# Patient Record
Sex: Female | Born: 1970 | Race: Black or African American | Hispanic: No | Marital: Married | Smoking: Never smoker
Health system: Southern US, Community
[De-identification: ages and names within clinical notes are randomized; demographics above are authoritative.]

## PROBLEM LIST (undated history)

## (undated) DIAGNOSIS — R002 Palpitations: Secondary | ICD-10-CM

## (undated) DIAGNOSIS — R42 Dizziness and giddiness: Secondary | ICD-10-CM

## (undated) DIAGNOSIS — E119 Type 2 diabetes mellitus without complications: Secondary | ICD-10-CM

## (undated) DIAGNOSIS — O021 Missed abortion: Secondary | ICD-10-CM

## (undated) DIAGNOSIS — Z973 Presence of spectacles and contact lenses: Secondary | ICD-10-CM

## (undated) HISTORY — DX: Type 2 diabetes mellitus without complications: E11.9

## (undated) HISTORY — DX: Palpitations: R00.2

---

## 2006-10-08 ENCOUNTER — Emergency Department (HOSPITAL_COMMUNITY): Admission: EM | Admit: 2006-10-08 | Discharge: 2006-10-08 | Payer: Self-pay | Admitting: Family Medicine

## 2008-06-03 HISTORY — PX: DILATION AND CURETTAGE OF UTERUS: SHX78

## 2011-11-11 ENCOUNTER — Ambulatory Visit (INDEPENDENT_AMBULATORY_CARE_PROVIDER_SITE_OTHER): Payer: BC Managed Care – PPO | Admitting: Family Medicine

## 2011-11-11 VITALS — BP 114/78 | HR 67 | Temp 98.2°F | Resp 16 | Ht 62.0 in | Wt 179.6 lb

## 2011-11-11 DIAGNOSIS — Z111 Encounter for screening for respiratory tuberculosis: Secondary | ICD-10-CM

## 2011-11-11 NOTE — Progress Notes (Signed)
    Patient Name: Madeline Maynard Date of Birth: [birth year] Medical Record Number: [id number] Gender: female Date of Encounter: 11/11/2011  History of Present Illness:  Madeline Maynard is a 41 y.o. very pleasant female patient who presents with the following:  Here for paperwork to care for foster children.  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  Otherwise no health problems.    There is no problem list on file for this patient.  No past medical history on file. No past surgical history on file. History  Substance Use Topics  . Smoking status: Never Smoker   . Smokeless tobacco: Not on file  . Alcohol Use: Not on file   No family history on file. No Known Allergies  Medication list has been reviewed and updated.  Prior to Admission medications   Not on File    Review of Systems:  As per HPI- otherwise negative. Reports that she has no illness, uses no medications, no limitations on her activities  Physical Examination: Filed Vitals:   11/11/11 1422  BP: 114/78  Pulse: 67  Temp: 98.2 F (36.8 C)  Resp: 16   Filed Vitals:   11/11/11 1422  Height: 5\' 2"  (1.575 m)  Weight: 179 lb 10.1 oz (81.48 kg)   Body mass index is 32.85 kg/(m^2).  GEN: WDWN, NAD, Non-toxic, A & O x 3 HEENT: Atraumatic, Normocephalic. Neck supple. No masses, No LAD. Ears and Nose: No external deformity. CV: RRR, No M/G/R. No JVD. No thrill. No extra heart sounds. PULM: CTA B, no wheezes, crackles, rhonchi. No retractions. No resp. distress. No accessory muscle use. EXTR: No c/c/e NEURO Normal gait.  PSYCH: Normally interactive. Conversant. Not depressed or anxious appearing.  Calm demeanor.    Assessment and Plan: Cleared for foster care.  History of BCG and positive PPD- she had a negative CXR in 08/2010.  Should be cleared for caring for foster children. If she needs an updated CXR we are glad to do this for her.     Abbe Amsterdam, MD

## 2011-12-02 DIAGNOSIS — Z0271 Encounter for disability determination: Secondary | ICD-10-CM

## 2011-12-20 ENCOUNTER — Encounter: Payer: Self-pay | Admitting: Family Medicine

## 2011-12-20 LAB — TB SKIN TEST

## 2012-03-20 ENCOUNTER — Ambulatory Visit (INDEPENDENT_AMBULATORY_CARE_PROVIDER_SITE_OTHER): Payer: BC Managed Care – PPO | Admitting: Family Medicine

## 2012-03-20 ENCOUNTER — Ambulatory Visit: Payer: BC Managed Care – PPO

## 2012-03-20 VITALS — BP 124/80 | HR 77 | Temp 98.3°F | Resp 16 | Ht 61.0 in | Wt 177.8 lb

## 2012-03-20 DIAGNOSIS — M542 Cervicalgia: Secondary | ICD-10-CM

## 2012-03-20 DIAGNOSIS — M62838 Other muscle spasm: Secondary | ICD-10-CM

## 2012-03-20 DIAGNOSIS — R51 Headache: Secondary | ICD-10-CM

## 2012-03-20 MED ORDER — CYCLOBENZAPRINE HCL 5 MG PO TABS: ORAL_TABLET | ORAL | Status: DC
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
## 2012-06-03 DIAGNOSIS — Z0271 Encounter for disability determination: Secondary | ICD-10-CM

## 2012-10-10 ENCOUNTER — Ambulatory Visit: Payer: BC Managed Care – PPO | Admitting: Internal Medicine

## 2012-10-10 ENCOUNTER — Ambulatory Visit: Payer: BC Managed Care – PPO

## 2012-10-10 VITALS — BP 110/66 | HR 77 | Temp 99.0°F | Resp 18 | Wt 186.0 lb

## 2012-10-10 DIAGNOSIS — S335XXA Sprain of ligaments of lumbar spine, initial encounter: Secondary | ICD-10-CM

## 2012-10-10 DIAGNOSIS — M545 Low back pain, unspecified: Secondary | ICD-10-CM

## 2012-10-10 DIAGNOSIS — S39012A Strain of muscle, fascia and tendon of lower back, initial encounter: Secondary | ICD-10-CM

## 2012-10-10 MED ORDER — METHOCARBAMOL 750 MG PO TABS: 750.0000 mg | ORAL_TABLET | Freq: Four times a day (QID) | ORAL | Status: DC

## 2012-10-10 MED ORDER — IBUPROFEN 600 MG PO TABS: 600.0000 mg | ORAL_TABLET | Freq: Three times a day (TID) | ORAL | Status: DC | PRN

## 2012-10-10 NOTE — Progress Notes (Signed)
  Subjective:    Patient ID: Madeline Maynard, female    DOB: 06-25-1970, 42 y.o.   MRN: [id number]  HPI Lumbar pain for 1 week, lifts heavy stuff all week. Pain is upper lumbar and no radiation, weakness or numbness into legs. Needs a note for work. No incontinence or rash.   Review of Systems neg    Objective:   Physical Exam  Vitals reviewed. Constitutional: She is oriented to person, place, and time. She appears well-developed and well-nourished.  Cardiovascular: Normal rate.   Pulmonary/Chest: Effort normal.  Abdominal: Soft. There is no tenderness.  Musculoskeletal: She exhibits tenderness.       Lumbar back: She exhibits decreased range of motion, tenderness, bony tenderness, pain and spasm. She exhibits no swelling, no edema, no deformity, no laceration and normal pulse.       Arms: Neurological: She is alert and oriented to person, place, and time. She has normal strength and normal reflexes. No cranial nerve deficit or sensory deficit. Coordination and gait normal.   [facility] reading (PRIMARY) by  Dr.Cyan Moultrie normal xr         Assessment & Plan:  Lumbar strain RICE/Motrin/Robaxin/Exercises

## 2012-10-10 NOTE — Patient Instructions (Signed)
Back Exercises Back exercises help treat and prevent back injuries. The goal of back exercises is to increase the strength of your abdominal and back muscles and the flexibility of your back. These exercises should be started when you no longer have back pain. Back exercises include:  Pelvic Tilt. Lie on your back with your knees bent. Tilt your pelvis until the lower part of your back is against the floor. Hold this position 5 to 10 sec and repeat 5 to 10 times.  Knee to Chest. Pull first 1 knee up against your chest and hold for 20 to 30 seconds, repeat this with the other knee, and then both knees. This may be done with the other leg straight or bent, whichever feels better.  Sit-Ups or Curl-Ups. Bend your knees 90 degrees. Start with tilting your pelvis, and do a partial, slow sit-up, lifting your trunk only 30 to 45 degrees off the floor. Take at least 2 to 3 seconds for each sit-up. Do not do sit-ups with your knees out straight. If partial sit-ups are difficult, simply do the above but with only tightening your abdominal muscles and holding it as directed.  Hip-Lift. Lie on your back with your knees flexed 90 degrees. Push down with your feet and shoulders as you raise your hips a couple inches off the floor; hold for 10 seconds, repeat 5 to 10 times.  Back arches. Lie on your stomach, propping yourself up on bent elbows. Slowly press on your hands, causing an arch in your low back. Repeat 3 to 5 times. Any initial stiffness and discomfort should lessen with repetition over time.  Shoulder-Lifts. Lie face down with arms beside your body. Keep hips and torso pressed to floor as you slowly lift your head and shoulders off the floor. Do not overdo your exercises, especially in the beginning. Exercises may cause you some mild back discomfort which lasts for a few minutes; however, if the pain is more severe, or lasts for more than 15 minutes, do not continue exercises until you see your caregiver.  Improvement with exercise therapy for back problems is slow.  See your caregivers for assistance with developing a proper back exercise program. Document Released: 06/27/2004 Document Revised: 08/12/2011 Document Reviewed: 03/21/2011 [employer] Patient Information 2013 [employer].  

## 2013-06-18 ENCOUNTER — Ambulatory Visit: Payer: BC Managed Care – PPO | Admitting: Physician Assistant

## 2013-06-18 VITALS — BP 102/60 | HR 73 | Temp 98.9°F | Resp 18 | Ht 61.0 in | Wt 180.0 lb

## 2013-06-18 DIAGNOSIS — N939 Abnormal uterine and vaginal bleeding, unspecified: Secondary | ICD-10-CM

## 2013-06-18 DIAGNOSIS — Z3201 Encounter for pregnancy test, result positive: Secondary | ICD-10-CM

## 2013-06-18 DIAGNOSIS — N926 Irregular menstruation, unspecified: Secondary | ICD-10-CM

## 2013-06-18 LAB — POCT URINE PREGNANCY: Preg Test, Ur: POSITIVE

## 2013-06-18 MED ORDER — PRENATAL VITAMINS 28-0.8 MG PO TABS: 1.0000 | ORAL_TABLET | Freq: Every day | ORAL | Status: DC

## 2013-06-18 NOTE — Progress Notes (Signed)
   Subjective:    Patient ID: Madeline Maynard, female    DOB: [birth year], 43 y.o.   MRN: [id number]  HPI 43 year old female presents for evaluation of missed period.  LNMP 05/16/13. She does have 28 day regular cycles.  Denies vaginal discharge, bleeding/spotting, abdominal pain, nausea, or vomiting.  Does admit to some breast tenderness and fatigue.  Requesting pregnancy test. Has not taken one at home. Not currently trying to become pregnant. Has 3 children at home - youngest in 9th grade.  Is engaged - her fiancee does not have any children but does want them.   Not currently taking any medications.  Denies ETOH or cigarette use.  Patient is otherwise doing well with no other concerns today.     Review of Systems  Gastrointestinal: Negative for nausea, vomiting and abdominal pain.  Genitourinary: Positive for menstrual problem (irregular). Negative for vaginal bleeding, vaginal discharge and pelvic pain.       Objective:   Physical Exam  Constitutional: She is oriented to person, place, and time. She appears well-developed and well-nourished.  HENT:  Head: Normocephalic and atraumatic.  Right Ear: External ear normal.  Left Ear: External ear normal.  Eyes: Conjunctivae are normal.  Neck: Normal range of motion.  Cardiovascular: Normal rate.   Pulmonary/Chest: Effort normal.  Neurological: She is oriented to person, place, and time.  Psychiatric: She has a normal mood and affect. Her behavior is normal. Judgment and thought content normal.     Results for orders placed in visit on 06/18/13  POCT URINE PREGNANCY      Result Value Range   Preg Test, Ur Positive          Assessment & Plan:  Abnormal menses - Plan: POCT urine pregnancy, Ambulatory referral to Obstetrics / Gynecology  Positive pregnancy test  Discussed positive test today.  She does plan to carry out pregnancy. Does not have OB at this time.  Would like to go to [facility] if possible Start  prenatal vitamins today.  Referral placed for 1st OB visit Follow up here or at [facility] as needed.

## 2013-07-14 ENCOUNTER — Encounter (HOSPITAL_BASED_OUTPATIENT_CLINIC_OR_DEPARTMENT_OTHER): Payer: Self-pay | Admitting: *Deleted

## 2013-07-14 NOTE — Progress Notes (Signed)
NPO AFTER MN WITH EXCEPTION CLEAR LIQUIDS UNTIL 0700.  PRE-ORDERS PENDING.

## 2013-07-15 ENCOUNTER — Ambulatory Visit (HOSPITAL_BASED_OUTPATIENT_CLINIC_OR_DEPARTMENT_OTHER): Payer: BC Managed Care – PPO | Admitting: Anesthesiology

## 2013-07-15 ENCOUNTER — Encounter (HOSPITAL_BASED_OUTPATIENT_CLINIC_OR_DEPARTMENT_OTHER): Payer: Self-pay | Admitting: *Deleted

## 2013-07-15 ENCOUNTER — Encounter (HOSPITAL_BASED_OUTPATIENT_CLINIC_OR_DEPARTMENT_OTHER)
Admission: RE | Disposition: A | Discharge status: Home / Self Care | Payer: Self-pay | Source: Ambulatory Visit | Attending: Obstetrics and Gynecology

## 2013-07-15 ENCOUNTER — Ambulatory Visit (HOSPITAL_BASED_OUTPATIENT_CLINIC_OR_DEPARTMENT_OTHER)
Admission: RE | Admit: 2013-07-15 | Discharge: 2013-07-15 | Disposition: A | Discharge status: Home / Self Care | Payer: BC Managed Care – PPO | Source: Ambulatory Visit | Attending: Obstetrics and Gynecology | Admitting: Obstetrics and Gynecology

## 2013-07-15 ENCOUNTER — Encounter (HOSPITAL_BASED_OUTPATIENT_CLINIC_OR_DEPARTMENT_OTHER): Payer: BC Managed Care – PPO | Admitting: Anesthesiology

## 2013-07-15 DIAGNOSIS — O021 Missed abortion: Secondary | ICD-10-CM | POA: Insufficient documentation

## 2013-07-15 HISTORY — DX: Presence of spectacles and contact lenses: Z97.3

## 2013-07-15 HISTORY — PX: DILATION AND EVACUATION: SHX1459

## 2013-07-15 HISTORY — DX: Missed abortion: O02.1

## 2013-07-15 LAB — CBC
HCT: 36.5 % (ref 36.0–46.0)
Hemoglobin: 12.3 g/dL (ref 12.0–15.0)
MCH: 26.9 pg (ref 26.0–34.0)
MCHC: 33.7 g/dL (ref 30.0–36.0)
MCV: 79.9 fL (ref 78.0–100.0)
Platelets: 242 10*3/uL (ref 150–400)
RBC: 4.57 MIL/uL (ref 3.87–5.11)
RDW: 15.5 % (ref 11.5–15.5)
WBC: 5.4 10*3/uL (ref 4.0–10.5)

## 2013-07-15 LAB — ABO/RH: ABO/RH(D): O POS

## 2013-07-15 SURGERY — DILATION AND EVACUATION, UTERUS
Anesthesia: Choice

## 2013-07-15 SURGERY — DILATION AND EVACUATION, UTERUS
Anesthesia: Monitor Anesthesia Care | Site: Uterus

## 2013-07-15 MED ORDER — DEXAMETHASONE SODIUM PHOSPHATE 4 MG/ML IJ SOLN
INTRAMUSCULAR | Status: DC | PRN
  Administered 2013-07-15: 10 mg via INTRAVENOUS

## 2013-07-15 MED ORDER — ONDANSETRON HCL 4 MG/2ML IJ SOLN
INTRAMUSCULAR | Status: DC | PRN
  Administered 2013-07-15: 4 mg via INTRAVENOUS

## 2013-07-15 MED ORDER — MIDAZOLAM HCL 2 MG/2ML IJ SOLN
INTRAMUSCULAR | Status: AC
  Filled 2013-07-15: qty 2

## 2013-07-15 MED ORDER — LACTATED RINGERS IV SOLN
INTRAVENOUS | Status: DC
  Administered 2013-07-15 (×3): via INTRAVENOUS
  Filled 2013-07-15: qty 1000

## 2013-07-15 MED ORDER — PROMETHAZINE HCL 25 MG/ML IJ SOLN
6.2500 mg | INTRAMUSCULAR | Status: DC | PRN
  Filled 2013-07-15: qty 1

## 2013-07-15 MED ORDER — KETOROLAC TROMETHAMINE 30 MG/ML IJ SOLN
INTRAMUSCULAR | Status: DC | PRN
  Administered 2013-07-15: 30 mg via INTRAVENOUS

## 2013-07-15 MED ORDER — IBUPROFEN 200 MG PO TABS: 600.0000 mg | ORAL_TABLET | Freq: Four times a day (QID) | ORAL | Status: DC | PRN

## 2013-07-15 MED ORDER — MIDAZOLAM HCL 5 MG/5ML IJ SOLN
INTRAMUSCULAR | Status: DC | PRN
  Administered 2013-07-15: 2 mg via INTRAVENOUS

## 2013-07-15 MED ORDER — DEXTROSE 5 % IV SOLN
2.0000 g | INTRAVENOUS | Status: AC
  Administered 2013-07-15: 2 g via INTRAVENOUS
  Filled 2013-07-15: qty 2

## 2013-07-15 MED ORDER — PROPOFOL 10 MG/ML IV EMUL
INTRAVENOUS | Status: DC | PRN
  Administered 2013-07-15: 200 ug/kg/min via INTRAVENOUS

## 2013-07-15 MED ORDER — FENTANYL CITRATE 0.05 MG/ML IJ SOLN
25.0000 ug | INTRAMUSCULAR | Status: DC | PRN
  Filled 2013-07-15: qty 1

## 2013-07-15 MED ORDER — FENTANYL CITRATE 0.05 MG/ML IJ SOLN
INTRAMUSCULAR | Status: AC
  Filled 2013-07-15: qty 6

## 2013-07-15 MED ORDER — FENTANYL CITRATE 0.05 MG/ML IJ SOLN
INTRAMUSCULAR | Status: DC | PRN
  Administered 2013-07-15: 25 ug via INTRAVENOUS
  Administered 2013-07-15: 50 ug via INTRAVENOUS
  Administered 2013-07-15: 25 ug via INTRAVENOUS

## 2013-07-15 MED ORDER — LIDOCAINE HCL 1 % IJ SOLN
INTRAMUSCULAR | Status: DC | PRN
  Administered 2013-07-15: 10 mL

## 2013-07-15 MED ORDER — HYDROCODONE-IBUPROFEN 7.5-200 MG PO TABS: 1.0000 | ORAL_TABLET | Freq: Three times a day (TID) | ORAL | Status: DC | PRN

## 2013-07-15 SURGICAL SUPPLY — 32 items
CATH ROBINSON RED A/P 16FR (CATHETERS) ×3 IMPLANT
CLOTH BEACON ORANGE TIMEOUT ST (SAFETY) ×3 IMPLANT
COVER TABLE BACK 60X90 (DRAPES) ×3 IMPLANT
DRAPE LG THREE QUARTER DISP (DRAPES) ×3 IMPLANT
DRAPE UNDERBUTTOCKS STRL (DRAPE) ×3 IMPLANT
DRESSING TELFA 8X3 (GAUZE/BANDAGES/DRESSINGS) ×3 IMPLANT
GLOVE BIO SURGEON STRL SZ 6.5 (GLOVE) ×1 IMPLANT
GLOVE BIO SURGEON STRL SZ7 (GLOVE) ×6 IMPLANT
GLOVE BIO SURGEONS STRL SZ 6.5 (GLOVE) ×1
GLOVE INDICATOR 7.0 STRL GRN (GLOVE) ×2 IMPLANT
GLOVE INDICATOR 7.5 STRL GRN (GLOVE) ×2 IMPLANT
GOWN STRL REIN XL XLG (GOWN DISPOSABLE) ×1 IMPLANT
GOWN STRL REUS W/ TWL LRG LVL3 (GOWN DISPOSABLE) ×1 IMPLANT
GOWN STRL REUS W/ TWL XL LVL3 (GOWN DISPOSABLE) IMPLANT
GOWN STRL REUS W/TWL LRG LVL3 (GOWN DISPOSABLE) ×6
GOWN STRL REUS W/TWL XL LVL3 (GOWN DISPOSABLE) ×3
KIT BERKELEY 1ST TRIMESTER 3/8 (MISCELLANEOUS) ×3 IMPLANT
LEGGING LITHOTOMY PAIR STRL (DRAPES) ×3 IMPLANT
NDL SPNL 22GX3.5 QUINCKE BK (NEEDLE) ×1 IMPLANT
NEEDLE SPNL 22GX3.5 QUINCKE BK (NEEDLE) ×3 IMPLANT
NS IRRIG 500ML POUR BTL (IV SOLUTION) IMPLANT
PACK BASIN DAY SURGERY FS (CUSTOM PROCEDURE TRAY) ×3 IMPLANT
PAD OB MATERNITY 4.3X12.25 (PERSONAL CARE ITEMS) ×3 IMPLANT
PAD PREP 24X48 CUFFED NSTRL (MISCELLANEOUS) ×3 IMPLANT
SURGILUBE 2OZ TUBE FLIPTOP (MISCELLANEOUS) ×3 IMPLANT
SYR CONTROL 10ML LL (SYRINGE) ×3 IMPLANT
TOWEL OR 17X24 6PK STRL BLUE (TOWEL DISPOSABLE) ×3 IMPLANT
TRAY DSU PREP LF (CUSTOM PROCEDURE TRAY) ×3 IMPLANT
VACURETTE 7MM CVD STRL WRAP (CANNULA) IMPLANT
VACURETTE 8 RIGID CVD (CANNULA) IMPLANT
VACURETTE 9 RIGID CVD (CANNULA) IMPLANT
WATER STERILE IRR 500ML POUR (IV SOLUTION) ×3 IMPLANT

## 2013-07-15 NOTE — Anesthesia Postprocedure Evaluation (Deleted)
Immediate Anesthesia Transfer of Care Note  Patient: Madeline Maynard  Procedure(s) Performed: Procedure(s) (LRB): DILATATION AND EVACUATION (N/A)  Patient Location: PACU  Anesthesia Type: MAC  Level of Consciousness: awake, alert , oriented and patient cooperative  Airway & Oxygen Therapy: Patient Spontanous Breathing and Patient connected to face mask oxygen  Post-op Assessment: Report given to PACU RN and Post -op Vital signs reviewed and stable  Post vital signs: Reviewed and stable  Complications: No apparent anesthesia complications 

## 2013-07-15 NOTE — Progress Notes (Signed)
The patient was re-examined with no change in status 

## 2013-07-15 NOTE — H&P (Signed)
[name]  DICTATION # [id number] CSN# [id number]   Meriel PicaHOLLAND,Madeline Antunes M, MD 07/15/2013 8:09 AM

## 2013-07-15 NOTE — Discharge Instructions (Signed)
° °  D & C Home care Instructions: ° ° °Personal hygiene:  Used sanitary napkins for vaginal drainage not tampons. Shower or tub bathe the day after your procedure. No douching until bleeding stops. Always wipe from front to back after  Elimination. ° °Activity: Do not drive or operate any equipment today. The effects of the anesthesia are still present and drowsiness may result. Rest today, not necessarily flat bed rest, just take it easy. You may resume your normal activity in one to 2 days. ° °Sexual activity: No intercourse for one week or as indicated by your physician ° °Diet: Eat a light diet as desired this evening. You may resume a regular diet tomorrow. ° °Return to work: One to 2 days. ° °General Expectations of your surgery: Vaginal bleeding should be no heavier than a normal period. Spotting may continue up to 10 days. Mild cramps may continue for a couple of days. You may have a regular period in 2-6 weeks. ° °Unexpected observations call your doctor if these occur: persistent or heavy bleeding. Severe abdominal cramping or pain. Elevation of temperature greater than 100°F. ° °Call for an appointment in one week. ° ° ° °Patient's Signature_______________________________________________________ ° °Nurse's Signature________________________________________________________ ° ° °Post Anesthesia Home Care Instructions ° °Activity: °Get plenty of rest for the remainder of the day. A responsible adult should stay with you for 24 hours following the procedure.  °For the next 24 hours, DO NOT: °-Drive a car °-Operate machinery °-Drink alcoholic beverages °-Take any medication unless instructed by your physician °-Make any legal decisions or sign important papers. ° °Meals: °Start with liquid foods such as gelatin or soup. Progress to regular foods as tolerated. Avoid greasy, spicy, heavy foods. If nausea and/or vomiting occur, drink only clear liquids until the nausea and/or vomiting subsides. Call your physician  if vomiting continues. ° °Special Instructions/Symptoms: °Your throat may feel dry or sore from the anesthesia or the breathing tube placed in your throat during surgery. If this causes discomfort, gargle with warm salt water. The discomfort should disappear within 24 hours. ° °

## 2013-07-15 NOTE — Op Note (Signed)
Preoperative diagnosis: Missed AB  Postoperative diagnosis: Same  Procedure: Dilatation and evacuation  Surgeon: Marcelle OverlieHolland  Anesthesia: Sedation plus paracervical block  Specimens removed: Products of conception, to pathology  Procedure and findings:  Patient taken the operating room after an adequate level of sedation was obtained with the legs in stirrups the perineum and vagina were prepped and draped in usual fashion for D&E. The bladder was drained, EUA carried out uterus was six-week size anterior adnexa negative. Appropriate timeout for taken at that point. Speculum was positioned cervix grasped with a tenaculum, paracervical block was then created by infiltrating at 3 and 9:00 submucosally, 5-7 cc 1% plain Xylocaine at each site after negative aspiration. Uterus sounded to 8-9 cm, progressively dilated to a 25-26 Pratt dilator. 7 mm curved curet was then used to curette a moderate amount of tissue, when no further tissue was noted, a medium blunt curette was used to explore the cavity revealing it to be clean. There was minimal bleeding. She tolerated this well went to recovery room in good condition.  Dictated with [facility]  Shavonne Ambroise M. Milana ObeyHolland M.D.

## 2013-07-15 NOTE — Transfer of Care (Signed)
Immediate Anesthesia Transfer of Care Note  Patient: Madeline Maynard  Procedure(s) Performed: Procedure(s) (LRB): DILATATION AND EVACUATION (N/A)  Patient Location: PACU  Anesthesia Type: MAC  Level of Consciousness: awake, alert , oriented and patient cooperative  Airway & Oxygen Therapy: Patient Spontanous Breathing and Patient connected to face mask oxygen  Post-op Assessment: Report given to PACU RN and Post -op Vital signs reviewed and stable  Post vital signs: Reviewed and stable  Complications: No apparent anesthesia complications

## 2013-07-15 NOTE — Anesthesia Preprocedure Evaluation (Addendum)
Anesthesia Evaluation  Patient identified by MRN, date of birth, ID band Patient awake    Reviewed: Allergy & Precautions, H&P , NPO status , Patient's Chart, lab work & pertinent test results  Airway Mallampati: II TM Distance: >3 FB Neck ROM: Full    Dental no notable dental hx.    Pulmonary neg pulmonary ROS,  breath sounds clear to auscultation  Pulmonary exam normal       Cardiovascular negative cardio ROS  Rhythm:Regular Rate:Normal     Neuro/Psych negative neurological ROS  negative psych ROS   GI/Hepatic negative GI ROS, Neg liver ROS,   Endo/Other  Obesity  Renal/GU negative Renal ROS  negative genitourinary   Musculoskeletal negative musculoskeletal ROS (+)   Abdominal   Peds negative pediatric ROS (+)  Hematology negative hematology ROS (+)   Anesthesia Other Findings   Reproductive/Obstetrics negative OB ROS                           Anesthesia Physical Anesthesia Plan  ASA: I  Anesthesia Plan: MAC   Post-op Pain Management:    Induction: Intravenous  Airway Management Planned: Simple Face Mask  Additional Equipment:   Intra-op Plan:   Post-operative Plan:   Informed Consent: I have reviewed the patients History and Physical, chart, labs and discussed the procedure including the risks, benefits and alternatives for the proposed anesthesia with the patient or authorized representative who has indicated his/her understanding and acceptance.   Dental advisory given  Plan Discussed with: CRNA and Surgeon  Anesthesia Plan Comments:        Anesthesia Quick Evaluation

## 2013-07-16 ENCOUNTER — Encounter (HOSPITAL_BASED_OUTPATIENT_CLINIC_OR_DEPARTMENT_OTHER): Payer: Self-pay | Admitting: Obstetrics and Gynecology

## 2013-07-16 NOTE — H&P (Signed)
NAMSu Monks:  Dara, SAINWORLA          ACCOUNT NO.:  [id number]  MEDICAL RECORD NO.:  [id number]  LOCATION:                                 FACILITY:  PHYSICIAN:  Duke Salviaichard M. Marcelle OverlieHolland, M.D.DATE OF BIRTH:  Nov 04, 1970  DATE OF ADMISSION: DATE OF DISCHARGE:                             HISTORY & PHYSICAL   CHIEF COMPLAINT:  Missed abortion.  HPI:  A 43 year old, G4, P3, I saw originally on 06/17/2013, on referral from Carmin RichmondHeather Morlepa from Urgent Care with a positive UPT.  She was unsure of her LMP, we saw her on 06/29/2013, had initial blood work including progesterone level 26.9, initial hCG was [id number].  On repeat, 48 hours had dropped to 19967.  However, 4 days later was back up to 36605, and 48 hours after that had plateaued just below that range.  She has not had any bleeding during this time.  Initial ultrasound on 06/29/2013, showed a small gestational sac-like structure.  There was no yolk sac or fetal pole noted.  Followup ultrasound on 07/09/2013, demonstrated FHR was 92 __________, but followup ultrasound 07/13/2013 demonstrated no FHR noted at all again with slight drop in her HCG consistent with missed AB.  She presents at this time for D and E, this procedure including specific risks related to bleeding infection, adjacent organ injury.  Discussed with her which she understands and accepts.  PAST MEDICAL HISTORY:  Allergies:  SULFA.  CURRENT MEDICATIONS:  Prenatal vitamins.  REVIEW OF SYSTEMS:  Significant for headache.  FAMILY HISTORY:  Significant for diabetes.  She has had 3 prior vaginal deliveries at term.  SOCIAL HISTORY:  Denies alcohol, tobacco, or drug use.  She is single.  PHYSICAL EXAMINATION:  VITAL SIGNS:  Temp 98.2, blood pressure 120/80. HEENT:  Unremarkable. NECK:  Supple without masses. LUNGS:  Clear. CARDIOVASCULAR:  Regular rate and rhythm without murmurs, rubs, gallops. BREASTS:  Without masses. ABDOMEN:  Soft, flat, nontender.  Vulva, vagina,  and cervix normal. Uterus was 6 week size.  Adnexa negative. EXTREMITIES:  Unremarkable. NEUROLOGIC:  Unremarkable.  IMPRESSION:  Missed abortion.  PLAN:  D and E.  Procedure and risks discussed as above.     Emine Lopata M. Marcelle OverlieHolland, M.D.     [facility]/MEDQ  D:  07/15/2013  T:  07/15/2013  Job:  [id number]

## 2013-07-16 NOTE — Anesthesia Postprocedure Evaluation (Signed)
Anesthesia Post Note  Patient: Madeline Maynard  Procedure(s) Performed: Procedure(s) (LRB): DILATATION AND EVACUATION (N/A)  Anesthesia type: MAC  Patient location: PACU  Post pain: Pain level controlled  Post assessment: Post-op Vital signs reviewed  Last Vitals:  Filed Vitals:   07/15/13 1515  BP: 118/80  Pulse: 66  Temp: 36.8 C  Resp: 19    Post vital signs: Reviewed  Level of consciousness: sedated  Complications: No apparent anesthesia complications

## 2013-08-28 ENCOUNTER — Other Ambulatory Visit: Payer: Self-pay | Admitting: Physician Assistant

## 2013-11-07 ENCOUNTER — Ambulatory Visit (INDEPENDENT_AMBULATORY_CARE_PROVIDER_SITE_OTHER): Payer: BC Managed Care – PPO | Admitting: Family Medicine

## 2013-11-07 ENCOUNTER — Ambulatory Visit: Payer: BC Managed Care – PPO

## 2013-11-07 VITALS — BP 118/78 | HR 67 | Temp 98.8°F | Resp 16 | Ht 62.0 in | Wt 185.0 lb

## 2013-11-07 DIAGNOSIS — Z0000 Encounter for general adult medical examination without abnormal findings: Secondary | ICD-10-CM

## 2013-11-07 LAB — POCT URINE PREGNANCY: Preg Test, Ur: NEGATIVE

## 2013-11-07 NOTE — Progress Notes (Signed)
Physical exam:  History: 43 year old lady who is here for a physical exam. 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 She has no acute medical complaints.  Past history: Major illnesses: None Operations: None except had a D&E earlier this year for a missed abortion.  She's not on any regular medications Allergies: Sulfa  Family history: Parents are deceased. Father had diabetes, as do a couple of her siblings. She has 2 siblings still living in [place].  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 She is a permanent resident in the [place] but she is from [place], has not been there for 11 years. She does not smoke drink or use drugs. She has 3 children and they have 2 foster children. She is divorced but has a boyfriend.  Review of systems:  Constitutional: Unremarkable HEENT: Unremarkable Eyes: Unremarkable Respiratory: Unremarkable Cardiovascular: Unremarkable Genitourinary: Unremarkable Gastrointestinal: Unremarkable Endocrine: Unremarkable Musculoskeletal: Unremarkable Hematologic: Unremarkable Neurologic: Unremarkable Hematologic: Unremarkable Psychiatric: Unremarkable  Preventive history: She is scheduled for mammogram. Has been a paucity she's had a Pap smear. She did have a female exam however when she had her surgery.  Physical exam: Pleasant lady in no acute distress. TMs are normal. Eyes PERRLA. Fundi benign. Throat clear. Teeth good. Neck supple without nodes or thyromegaly. No carotid bruits. Chest is clear to auscultation. Heart regular without murmurs. Abdomen soft without mass or tenderness. No axillary or inguinal nodes. Pelvic and breast exam not done. Extremities unremarkable. Skin unremarkable.  Assessment: Normal physical examination  Plan: Complete form for DSS  Check lipids, C. met, TSH.  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  [facility] reading (PRIMARY) by  Dr. Linna Darner Normal cxr.  No evidence of tb .

## 2013-11-07 NOTE — Patient Instructions (Addendum)
Return as necessary

## 2013-11-07 NOTE — Progress Notes (Signed)
  Tuberculosis Risk Questionnaire  1. Yes [place] Were you born outside the [place] in one of the following parts of the world: [place], [place], [place], [place] or [place]?    2. No Have you traveled outside the [place] and lived for more than one month in one of the following parts of the world: [place], [place], [place], [place] or [place]?    3. No Do you have a compromised immune system such as from any of the following conditions:HIV/AIDS, organ or bone marrow transplantation, diabetes, immunosuppressive medicines (e.g. Prednisone, Remicaide), leukemia, lymphoma, cancer of the head or neck, gastrectomy or jejunal bypass, end-stage renal disease (on dialysis), or silicosis?     4. No Have you ever or do you plan on working in: a residential care center, a health care facility, a jail or prison or homeless shelter?    5. No Have you ever: injected illegal drugs, used crack cocaine, lived in a homeless shelter  or been in jail or prison?     6. No Have you ever been exposed to anyone with infectious tuberculosis?    Tuberculosis Symptom Questionnaire  Do you currently have any of the following symptoms?  1. No Unexplained cough lasting more than 3 weeks?   2. No Unexplained fever lasting more than 3 weeks.   3. No Night Sweats (sweating that leaves the bedclothes and sheets wet)     4. No Shortness of Breath   5. No Chest Pain   6. No Unintentional weight loss    7. No Unexplained fatigue (very tired for no reason)

## 2013-11-08 LAB — LIPID PANEL
Cholesterol: 169 mg/dL (ref 0–200)
HDL: 77 mg/dL (ref >39)
LDL Cholesterol: 81 mg/dL (ref 0–99)
Total CHOL/HDL Ratio: 2.2 Ratio
Triglycerides: 54 mg/dL (ref <150)
VLDL: 11 mg/dL (ref 0–40)

## 2013-11-08 LAB — COMPREHENSIVE METABOLIC PANEL
ALT: 8 U/L (ref 0–35)
AST: 14 U/L (ref 0–37)
Albumin: 4.1 g/dL (ref 3.5–5.2)
Alkaline Phosphatase: 58 U/L (ref 39–117)
BUN: 12 mg/dL (ref 6–23)
CO2: 23 mEq/L (ref 19–32)
Calcium: 8.9 mg/dL (ref 8.4–10.5)
Chloride: 105 mEq/L (ref 96–112)
Creat: 0.76 mg/dL (ref 0.50–1.10)
Glucose, Bld: 90 mg/dL (ref 70–99)
Potassium: 3.9 mEq/L (ref 3.5–5.3)
Sodium: 138 mEq/L (ref 135–145)
Total Bilirubin: 0.6 mg/dL (ref 0.2–1.2)
Total Protein: 7.8 g/dL (ref 6.0–8.3)

## 2013-11-08 LAB — TSH: TSH: 0.684 u[IU]/mL (ref 0.350–4.500)

## 2013-11-10 ENCOUNTER — Encounter: Payer: Self-pay | Admitting: Radiology

## 2014-01-12 ENCOUNTER — Other Ambulatory Visit (HOSPITAL_COMMUNITY): Payer: Self-pay | Admitting: Obstetrics and Gynecology

## 2014-01-12 DIAGNOSIS — Z3141 Encounter for fertility testing: Secondary | ICD-10-CM

## 2014-01-19 ENCOUNTER — Ambulatory Visit (HOSPITAL_COMMUNITY)
Admission: RE | Admit: 2014-01-19 | Discharge: 2014-01-19 | Disposition: A | Discharge status: Home / Self Care | Payer: BC Managed Care – PPO | Source: Ambulatory Visit | Attending: Obstetrics and Gynecology | Admitting: Obstetrics and Gynecology

## 2014-01-19 DIAGNOSIS — N979 Female infertility, unspecified: Secondary | ICD-10-CM | POA: Insufficient documentation

## 2014-01-19 DIAGNOSIS — Z3141 Encounter for fertility testing: Secondary | ICD-10-CM

## 2014-01-19 MED ORDER — IOHEXOL 300 MG/ML  SOLN
20.0000 mL | Freq: Once | INTRAMUSCULAR | Status: AC | PRN
  Administered 2014-01-19: 20 mL

## 2014-07-02 ENCOUNTER — Emergency Department (HOSPITAL_COMMUNITY)
Admission: EM | Admit: 2014-07-02 | Discharge: 2014-07-02 | Disposition: A | Discharge status: Home / Self Care | Payer: BLUE CROSS/BLUE SHIELD | Source: Home / Self Care | Attending: Emergency Medicine | Admitting: Emergency Medicine

## 2014-07-02 ENCOUNTER — Encounter (HOSPITAL_COMMUNITY): Payer: Self-pay | Admitting: Emergency Medicine

## 2014-07-02 DIAGNOSIS — M545 Low back pain, unspecified: Secondary | ICD-10-CM

## 2014-07-02 DIAGNOSIS — S39012A Strain of muscle, fascia and tendon of lower back, initial encounter: Secondary | ICD-10-CM

## 2014-07-02 MED ORDER — DICLOFENAC SODIUM 1 % TD GEL
1.0000 | Freq: Four times a day (QID) | TRANSDERMAL | Status: DC
Start: 2014-07-02 — End: 2017-08-09

## 2014-07-02 MED ORDER — TRAMADOL HCL 50 MG PO TABS: 50.0000 mg | ORAL_TABLET | Freq: Four times a day (QID) | ORAL | Status: DC | PRN

## 2014-07-02 NOTE — ED Provider Notes (Signed)
CSN: [id number]     Arrival date & time 07/02/14  1352 History   First MD Initiated Contact with Patient 07/02/14 1552     Chief Complaint  Patient presents with  . Back Pain  . Wrist Pain   (Consider location/radiation/quality/duration/timing/severity/associated sxs/prior Treatment) HPI Comments: 44 year old female was shoveling snow approximately one week ago. Approximately one day later she experienced pain across the lower back. She states that it hurts to lean forward. She has continued to work since she developed the pain in her work requires prolonged standing which seems to exacerbate her discomfort. There is no radiation of pain. No focalized paresthesias or weakness. No known trauma or fall.   Past Medical History  Diagnosis Date  . Wears glasses   . Missed abortion    Past Surgical History  Procedure Laterality Date  . Dilation and curettage of uterus  2010  . Dilation and evacuation N/A 07/15/2013    Procedure: DILATATION AND EVACUATION;  Surgeon: Meriel Picaichard M Holland, MD;  [facility];  Service: Gynecology;  Laterality: N/A;   Family History  Problem Relation Age of Onset  . Diabetes Sister    History  Substance Use Topics  . Smoking status: Never Smoker   . Smokeless tobacco: Never Used  . Alcohol Use: No   OB History    No data available     Review of Systems  Constitutional: Positive for activity change. Negative for fever and chills.  HENT: Negative.   Respiratory: Negative.   Cardiovascular: Negative for chest pain.  Musculoskeletal: Positive for myalgias and back pain. Negative for joint swelling, neck pain and neck stiffness.       As per HPI  Skin: Negative for color change and rash.  Neurological: Negative.     Allergies  Sulfa antibiotics  Home Medications   Prior to Admission medications   Medication Sig Start Date End Date Taking? Authorizing Provider  diclofenac sodium (VOLTAREN) 1 % GEL Apply 1 application  topically 4 (four) times daily. 07/02/14   Hayden Rasmussenavid Alvie Speltz, NP  traMADol (ULTRAM) 50 MG tablet Take 1 tablet (50 mg total) by mouth every 6 (six) hours as needed. 07/02/14   Hayden Rasmussenavid Nanea Jared, NP   BP 128/86 mmHg  Pulse 75  Temp(Src) 98.3 F (36.8 C) (Oral)  Resp 18  SpO2 100%  LMP 06/06/2014 Physical Exam  Constitutional: She is oriented to person, place, and time. She appears well-developed and well-nourished. No distress.  HENT:  Head: Normocephalic and atraumatic.  Eyes: EOM are normal.  Neck: Normal range of motion. Neck supple.  Cardiovascular: Normal rate.   Pulmonary/Chest: Effort normal. No respiratory distress.  Musculoskeletal: She exhibits tenderness. She exhibits no edema.  Tenderness across the lower paralumbar musculature. No spinal tenderness or deformity. No discoloration or swelling. Patient is ambulatory with smooth and balanced gait. Patient is able to flex from 90 sitting to 45 leaning forward.  Neurological: She is alert and oriented to person, place, and time. She has normal reflexes. No cranial nerve deficit. She exhibits normal muscle tone.  Skin: Skin is warm and dry.  Nursing note and vitals reviewed.   ED Course  Procedures (including critical care time) Labs Review Labs Reviewed - No data to display  Imaging Review No results found.   MDM   1. Bilateral low back pain without sciatica   2. Lumbar strain, initial encounter    Heat for now Stretches as demonstrated Diclofenac gel Tramadol 50 mg #15.    Onalee Huaavid  Mahealani Sulak, NP 07/02/14 1607

## 2014-07-02 NOTE — ED Notes (Signed)
Lower back pain states started shortly after shoveling snow.  Denies any urinary symptoms.   On set 3 days ago.  Mild relief with otc meds.

## 2014-07-02 NOTE — Discharge Instructions (Signed)
Lumbosacral Strain Heat for now Stretches as demonstrated Lumbosacral strain is a strain of any of the parts that make up your lumbosacral vertebrae. Your lumbosacral vertebrae are the bones that make up the lower third of your backbone. Your lumbosacral vertebrae are held together by muscles and tough, fibrous tissue (ligaments).  CAUSES  A sudden blow to your back can cause lumbosacral strain. Also, anything that causes an excessive stretch of the muscles in the low back can cause this strain. This is typically seen when people exert themselves strenuously, fall, lift heavy objects, bend, or crouch repeatedly. RISK FACTORS  Physically demanding work.  Participation in pushing or pulling sports or sports that require a sudden twist of the back (tennis, golf, baseball).  Weight lifting.  Excessive lower back curvature.  Forward-tilted pelvis.  Weak back or abdominal muscles or both.  Tight hamstrings. SIGNS AND SYMPTOMS  Lumbosacral strain may cause pain in the area of your injury or pain that moves (radiates) down your leg.  DIAGNOSIS Your health care provider can often diagnose lumbosacral strain through a physical exam. In some cases, you may need tests such as X-ray exams.  TREATMENT  Treatment for your lower back injury depends on many factors that your clinician will have to evaluate. However, most treatment will include the use of anti-inflammatory medicines. HOME CARE INSTRUCTIONS   Avoid hard physical activities (tennis, racquetball, waterskiing) if you are not in proper physical condition for it. This may aggravate or create problems.  If you have a back problem, avoid sports requiring sudden body movements. Swimming and walking are generally safer activities.  Maintain good posture.  Maintain a healthy weight.  For acute conditions, you may put ice on the injured area.  Put ice in a plastic bag.  Place a towel between your skin and the bag.  Leave the ice on for  20 minutes, 2-3 times a day.  When the low back starts healing, stretching and strengthening exercises may be recommended. SEEK MEDICAL CARE IF:  Your back pain is getting worse.  You experience severe back pain not relieved with medicines. SEEK IMMEDIATE MEDICAL CARE IF:   You have numbness, tingling, weakness, or problems with the use of your arms or legs.  There is a change in bowel or bladder control.  You have increasing pain in any area of the body, including your belly (abdomen).  You notice shortness of breath, dizziness, or feel faint.  You feel sick to your stomach (nauseous), are throwing up (vomiting), or become sweaty.  You notice discoloration of your toes or legs, or your feet get very cold. MAKE SURE YOU:   Understand these instructions.  Will watch your condition.  Will get help right away if you are not doing well or get worse. Document Released: 02/27/2005 Document Revised: 05/25/2013 Document Reviewed: 01/06/2013 [facility] Patient Information 2015 [place], [place]. This information is not intended to replace advice given to you by your health care provider. Make sure you discuss any questions you have with your health care provider.  Muscle Strain A muscle strain is an injury that occurs when a muscle is stretched beyond its normal length. Usually a small number of muscle fibers are torn when this happens. Muscle strain is rated in degrees. First-degree strains have the least amount of muscle fiber tearing and pain. Second-degree and third-degree strains have increasingly more tearing and pain.  Usually, recovery from muscle strain takes 1-2 weeks. Complete healing takes 5-6 weeks.  CAUSES  Muscle strain happens  when a sudden, violent force placed on a muscle stretches it too far. This may occur with lifting, sports, or a fall.  RISK FACTORS Muscle strain is especially common in athletes.  SIGNS AND SYMPTOMS At the site of the muscle strain, there may  be:  Pain.  Bruising.  Swelling.  Difficulty using the muscle due to pain or lack of normal function. DIAGNOSIS  Your health care provider will perform a physical exam and ask about your medical history. TREATMENT  Often, the best treatment for a muscle strain is resting, icing, and applying cold compresses to the injured area.  HOME CARE INSTRUCTIONS   Use the PRICE method of treatment to promote muscle healing during the first 2-3 days after your injury. The PRICE method involves:  Protecting the muscle from being injured again.  Restricting your activity and resting the injured body part.  Icing your injury. To do this, put ice in a plastic bag. Place a towel between your skin and the bag. Then, apply the ice and leave it on from 15-20 minutes each hour. After the third day, switch to moist heat packs.  Apply compression to the injured area with a splint or elastic bandage. Be careful not to wrap it too tightly. This may interfere with blood circulation or increase swelling.  Elevate the injured body part above the level of your heart as often as you can.  Only take over-the-counter or prescription medicines for pain, discomfort, or fever as directed by your health care provider.  Warming up prior to exercise helps to prevent future muscle strains. SEEK MEDICAL CARE IF:   You have increasing pain or swelling in the injured area.  You have numbness, tingling, or a significant loss of strength in the injured area. MAKE SURE YOU:   Understand these instructions.  Will watch your condition.  Will get help right away if you are not doing well or get worse. Document Released: 05/20/2005 Document Revised: 03/10/2013 Document Reviewed: 12/17/2012 [facility] Patient Information 2015 [place]. This information is not intended to replace advice given to you by your health care provider. Make sure you discuss any questions you have with your health care provider.

## 2014-12-16 ENCOUNTER — Other Ambulatory Visit: Payer: Self-pay | Admitting: Obstetrics and Gynecology

## 2014-12-19 LAB — CYTOLOGY - PAP

## 2015-08-01 IMAGING — RF DG HYSTEROGRAM
3 series · 3 of 3 positions shown · non-contrast
Comparison: None.

CLINICAL DATA: Infertility

EXAM:
HYSTEROSALPINGOGRAM
TECHNIQUE: Hysterosalpingogram was performed by the ordering physician under
fluoroscopy. Fluoroscopic images were submitted for radiologic
interpretation following the procedure. Please see the procedural
report for the amount of contrast and the fluoroscopy time utilized.

[Series 1: run · 1 of 1 slices shown (1 of 3)]
[im 1/1]
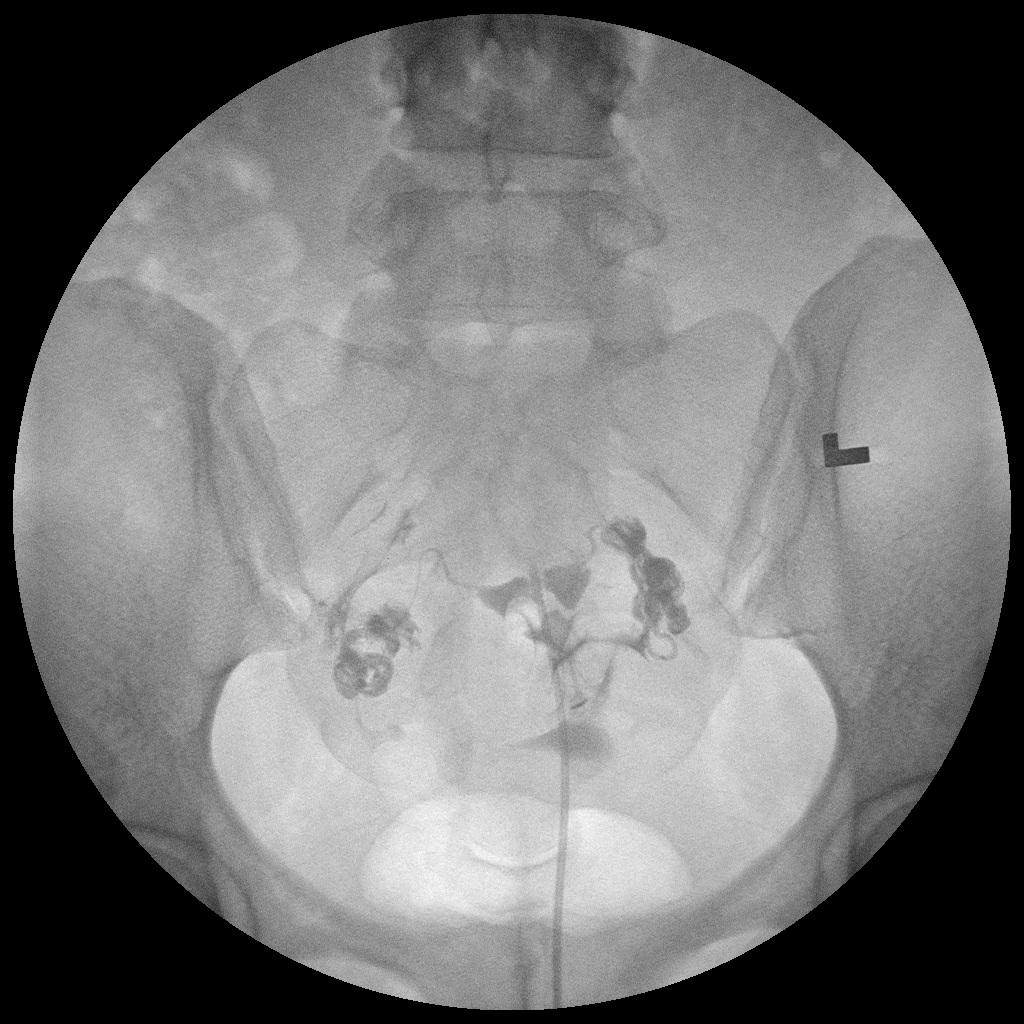

[Series 2: run · 1 of 1 slices shown (2 of 3)]
[im 1/1]
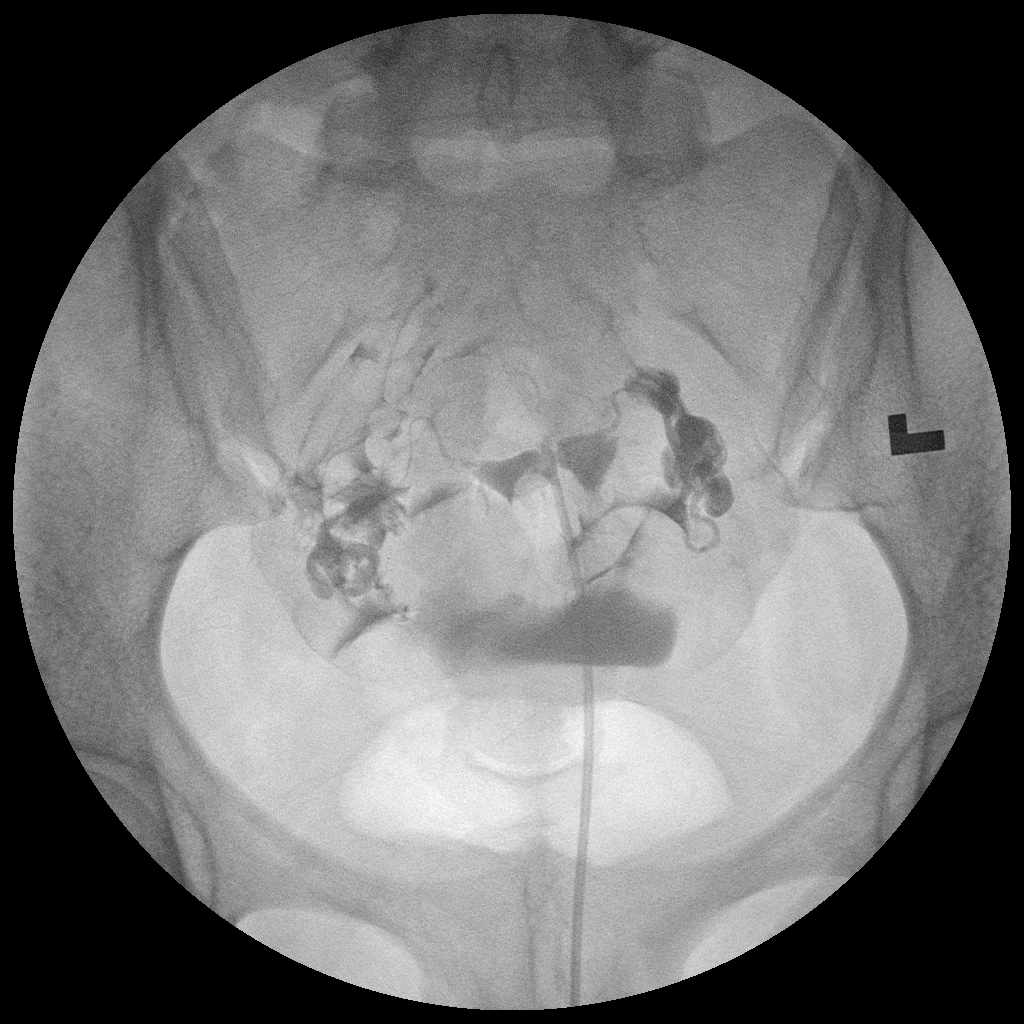

[Series 3: run · 1 of 1 slices shown (3 of 3)]
[im 1/1]
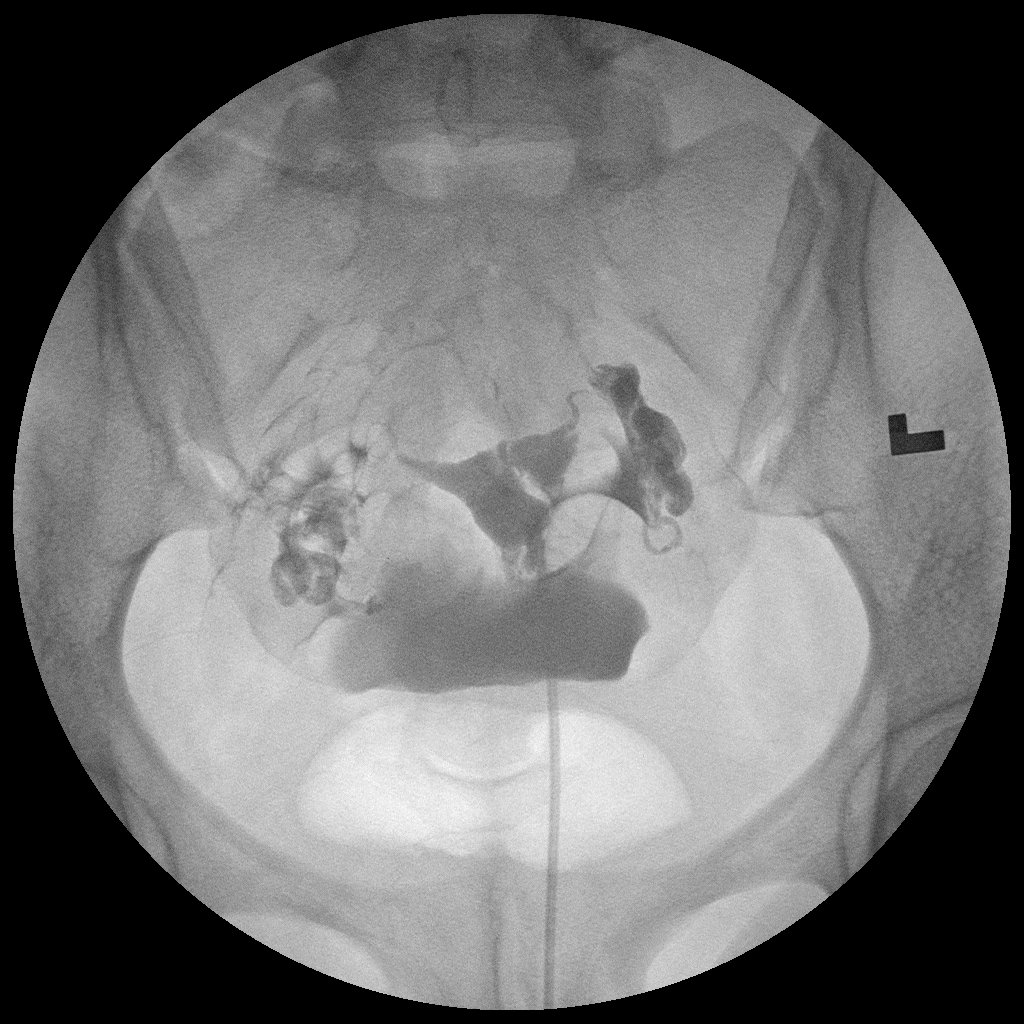

[3 of 3 positions shown; findings below may reference images not displayed]

FINDINGS: Endometrial cavity is normal. Both fallopian tubes fill and have a
normal appearance. Normal spillage bilaterally.
IMPRESSION: Unremarkable study.

## 2017-08-09 ENCOUNTER — Ambulatory Visit: Payer: BLUE CROSS/BLUE SHIELD | Admitting: Family Medicine

## 2017-08-09 ENCOUNTER — Telehealth: Payer: Self-pay | Admitting: Family Medicine

## 2017-08-09 ENCOUNTER — Encounter: Payer: Self-pay | Admitting: Family Medicine

## 2017-08-09 ENCOUNTER — Ambulatory Visit (INDEPENDENT_AMBULATORY_CARE_PROVIDER_SITE_OTHER): Payer: BLUE CROSS/BLUE SHIELD

## 2017-08-09 VITALS — BP 128/80 | HR 80 | Temp 98.1°F | Resp 16 | Ht 60.5 in | Wt 192.4 lb

## 2017-08-09 DIAGNOSIS — Z9229 Personal history of other drug therapy: Secondary | ICD-10-CM | POA: Diagnosis not present

## 2017-08-09 DIAGNOSIS — Z111 Encounter for screening for respiratory tuberculosis: Secondary | ICD-10-CM | POA: Diagnosis not present

## 2017-08-09 DIAGNOSIS — R7611 Nonspecific reaction to tuberculin skin test without active tuberculosis: Secondary | ICD-10-CM | POA: Diagnosis not present

## 2017-08-09 NOTE — Telephone Encounter (Signed)
Dr. Neva SeatGreene requested a copy of Madeline Maynard medical evaluation [place] [facility]. Placing in providers box.

## 2017-08-09 NOTE — Patient Instructions (Addendum)
  Heart size was borderline enlarged on your x-ray, but can follow-up to recheck that further if needed. There were no signs of active tuberculosis. Let me know if there are questions.   IF you received an x-ray today, you will receive an invoice from [facility]. Please contact [facility] at [phone] with questions or concerns regarding your invoice.   IF you received labwork today, you will receive an invoice from [facility]. Please contact [facility] at [phone] with questions or concerns regarding your invoice.   Our billing staff will not be able to assist you with questions regarding bills from these companies.  You will be contacted with the lab results as soon as they are available. The fastest way to get your results is to activate your My Chart account. Instructions are located on the last page of this paperwork. If you have not heard from us regarding the results in 2 weeks, please contact this office.

## 2017-08-09 NOTE — Progress Notes (Signed)
Subjective:    Patient ID: Madeline Maynard, female    DOB: 09-02-1970, 47 y.o.   MRN: [id number]  HPI Madeline Maynard is a 47 y.o. female Presents today for: Chief Complaint  Patient presents with  . New Patient (Initial Visit)    needs TB paperwork filled out with cxr and tb test    Madeline Maynard parent exam/physical (last one 3 years ago) and CXR for tb screening. 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 No treatment for TB - thought to have been positive due to BCG vaccine. No out of country travel since 2003.   PCP: Dr. Renaldo Fiddler at [facility]. Physical planned May 16th.   No chronic medical problems or Rx meds. No physical limitations. No history of psychiatric illness  Depression screen PHQ 2/9 08/09/2017  Decreased Interest 0  Down, Depressed, Hopeless 0  PHQ - 2 Score 0    Tuberculosis Risk Questionnaire  1. Yes Madeline Maynard. Had BCG vaccine.  Were you born outside the [place] in one of the following parts of the world: [place], [place], [place], [place] or [place]?    2. Yes as above, but not since 2003.  Have you traveled outside the [place] and lived for more than one month in one of the following parts of the world: [place], [place], [place], [place] or [place]?    3. No Do you have a compromised immune system such as from any of the following conditions:HIV/AIDS, organ or bone marrow transplantation, diabetes, immunosuppressive medicines (e.g. Prednisone, Remicaide), leukemia, lymphoma, cancer of the head or neck, gastrectomy or jejunal bypass, end-stage renal disease (on dialysis), or silicosis?     4. No Have you ever or do you plan on working in: a residential care center, a health care facility, a jail or prison or homeless shelter?    5. No Have you ever: injected illegal drugs, used crack cocaine, lived in a homeless shelter  or been in jail or prison?     6. No Have you ever been exposed to anyone with infectious  tuberculosis?  7. Yes  Have you ever had a BCG vaccine? (BCG is a vaccine for tuberculosis  (TB) used in OTHER countries, NOT in the [place]).  8. Yes due to BCG vaccine Have you ever been advised by a health care provider NOT to have a TB skin test?  9. Yes  Have you ever had a POSITIVE TB skin test?  IF SO, when? 3-4 years ago.   IF SO, were you treated with INH? n/a  IF SO, where? n/a  Tuberculosis Symptom Questionnaire  Do you currently have any of the following symptoms?  1. No Unexplained cough lasting more than 3 weeks?   2. No Unexplained fever lasting more than 3 weeks.   3. No Night Sweats (sweating that leaves the bedclothes and sheets wet)     4. No Shortness of Breath   5. No Chest Pain   6. No Unintentional weight loss    7. No Unexplained fatigue (very tired for no reason)   There are no active problems to display for this patient.  Past Medical History:  Diagnosis Date  . Missed abortion   . Wears glasses    Past Surgical History:  Procedure Laterality Date  . DILATION AND CURETTAGE OF UTERUS  2010  . DILATION AND EVACUATION N/A 07/15/2013   Procedure: DILATATION AND EVACUATION;  Surgeon: [name], MD;  Location: [facility];  Service: Gynecology;  Laterality: N/A;   Allergies  Allergen Reactions  . Sulfa Antibiotics Hives   Prior to Admission medications   Not on File   Social History   Socioeconomic History  . Marital status: Divorced    Spouse name: Not on file  . Number of children: Not on file  . Years of education: Not on file  . Highest education level: Not on file  Social Needs  . Financial resource strain: Not on file  . Food insecurity - worry: Not on file  . Food insecurity - inability: Not on file  . Transportation needs - medical: Not on file  . Transportation needs - non-medical: Not on file  Occupational History  . Not on file  Tobacco Use  . Smoking status: Never Smoker  . Smokeless tobacco: Never  Used  Substance and Sexual Activity  . Alcohol use: No  . Drug use: No  . Sexual activity: Yes    Birth control/protection: None  Other Topics Concern  . Not on file  Social History Narrative  . Not on file    Review of Systems  Constitutional: Negative for chills, diaphoresis, fever and unexpected weight change.  Respiratory: Negative for cough and shortness of breath.   Cardiovascular: Negative for chest pain.       Objective:   Physical Exam  Constitutional: She is oriented to person, place, and time. She appears well-developed and well-nourished. No distress.  HENT:  Head: Normocephalic and atraumatic.  Right Ear: Hearing, tympanic membrane and ear canal normal.  Left Ear: Hearing, tympanic membrane and ear canal normal.  Eyes: Conjunctivae and EOM are normal. Pupils are equal, round, and reactive to light.  Cardiovascular: Normal rate, regular rhythm, normal heart sounds and intact distal pulses.  No murmur heard. Pulmonary/Chest: Effort normal and breath sounds normal. No respiratory distress. She has no wheezes. She has no rhonchi.  Neurological: She is alert and oriented to person, place, and time.  Skin: Skin is warm and dry. No rash noted.  Psychiatric: She has a normal mood and affect. Her behavior is normal.  Vitals reviewed.  Vitals:   08/09/17 1356  BP: 128/80  Pulse: 80  Resp: 16  Temp: 98.1 F (36.7 C)  TempSrc: Oral  SpO2: 100%  Weight: 192 lb 6.4 oz (87.3 kg)  Height: 5' 0.5" (1.537 m)   Dg Chest 1 View  Result Date: 08/09/2017 CLINICAL DATA:  screening for active TB. prior BCG vaccine with positive PPD. EXAM: CHEST 1 VIEW COMPARISON:  Chest x-ray dated 11/07/2013 FINDINGS: Mild cardiomegaly. Lungs are clear. No pleural effusion. Osseous structures about the chest are unremarkable. IMPRESSION: 1. No active disease.  No radiographic evidence of TB. 2. Mild cardiomegaly. Electronically Signed   By: Madeline Maynard M.D.   On: 08/09/2017 14:54         Assessment & Plan:  Madeline Maynard is a 47 y.o. 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 Asymptomatic and no known exposure to tuberculosis. CXR with mild cardiomegaly, but no signs of TB. Reviewed form for foster parent clearance. No concerns on exam or history  - form completed - see scanned copy.   - can follow to discuss cardiomegaly, but asymptomatic at present with normal BP. RTC precautions.   No orders of the defined types were placed in this encounter.  Patient  Instructions    Heart size was borderline enlarged on your x-ray, but can follow-up to recheck that further if needed. There were no signs of active tuberculosis. Let me know if there are questions.   IF you received an x-ray today, you will receive an invoice from [facility]. Please contact [facility] at [phone] with questions or concerns regarding your invoice.   IF you received labwork today, you will receive an invoice from [facility]. Please contact [facility] at [phone] with questions or concerns regarding your invoice.   Our billing staff will not be able to assist you with questions regarding bills from these companies.  You will be contacted with the lab results as soon as they are available. The fastest way to get your results is to activate your My Chart account. Instructions are located on the last page of this paperwork. If you have not heard from us regarding the results in 2 weeks, please contact this office.      Signed,   Meredith StaggersJeffrey Lynda Capistran, MD Primary [facility] at [facility].  08/13/17 3:30 PM

## 2017-08-11 NOTE — Telephone Encounter (Signed)
Provider, FYI. 

## 2017-10-16 DIAGNOSIS — Z01419 Encounter for gynecological examination (general) (routine) without abnormal findings: Secondary | ICD-10-CM | POA: Diagnosis not present

## 2017-10-16 DIAGNOSIS — Z1231 Encounter for screening mammogram for malignant neoplasm of breast: Secondary | ICD-10-CM | POA: Diagnosis not present

## 2017-10-16 DIAGNOSIS — Z6835 Body mass index (BMI) 35.0-35.9, adult: Secondary | ICD-10-CM | POA: Diagnosis not present

## 2018-09-17 ENCOUNTER — Ambulatory Visit (HOSPITAL_COMMUNITY)
Admission: EM | Admit: 2018-09-17 | Discharge: 2018-09-17 | Disposition: A | Discharge status: Home / Self Care | Payer: BLUE CROSS/BLUE SHIELD | Attending: Internal Medicine | Admitting: Internal Medicine

## 2018-09-17 ENCOUNTER — Other Ambulatory Visit: Payer: Self-pay

## 2018-09-17 ENCOUNTER — Encounter (HOSPITAL_COMMUNITY): Payer: Self-pay | Admitting: Emergency Medicine

## 2018-09-17 DIAGNOSIS — D649 Anemia, unspecified: Secondary | ICD-10-CM

## 2018-09-17 DIAGNOSIS — R739 Hyperglycemia, unspecified: Secondary | ICD-10-CM

## 2018-09-17 DIAGNOSIS — R5383 Other fatigue: Secondary | ICD-10-CM | POA: Diagnosis not present

## 2018-09-17 LAB — CBC WITH DIFFERENTIAL/PLATELET
Abs Immature Granulocytes: 0.01 10*3/uL (ref 0.00–0.07)
Basophils Absolute: 0 10*3/uL (ref 0.0–0.1)
Basophils Relative: 0 %
Eosinophils Absolute: 0 10*3/uL (ref 0.0–0.5)
Eosinophils Relative: 0 %
HCT: 35.8 % — ABNORMAL LOW (ref 36.0–46.0)
Hemoglobin: 11.3 g/dL — ABNORMAL LOW (ref 12.0–15.0)
Immature Granulocytes: 0 %
Lymphocytes Relative: 17 %
Lymphs Abs: 1.1 10*3/uL (ref 0.7–4.0)
MCH: 25.2 pg — ABNORMAL LOW (ref 26.0–34.0)
MCHC: 31.6 g/dL (ref 30.0–36.0)
MCV: 79.9 fL — ABNORMAL LOW (ref 80.0–100.0)
Monocytes Absolute: 0.4 10*3/uL (ref 0.1–1.0)
Monocytes Relative: 5 %
Neutro Abs: 5.2 10*3/uL (ref 1.7–7.7)
Neutrophils Relative %: 78 %
Platelets: 218 10*3/uL (ref 150–400)
RBC: 4.48 MIL/uL (ref 3.87–5.11)
RDW: 14.6 % (ref 11.5–15.5)
WBC: 6.7 10*3/uL (ref 4.0–10.5)
nRBC: 0 % (ref 0.0–0.2)

## 2018-09-17 LAB — BASIC METABOLIC PANEL
Anion gap: 9 (ref 5–15)
BUN: 12 mg/dL (ref 6–20)
CO2: 21 mmol/L — ABNORMAL LOW (ref 22–32)
Calcium: 9.1 mg/dL (ref 8.9–10.3)
Chloride: 104 mmol/L (ref 98–111)
Creatinine, Ser: 1.02 mg/dL — ABNORMAL HIGH (ref 0.44–1.00)
GFR calc Af Amer: >60 mL/min (ref >60)
GFR calc non Af Amer: >60 mL/min (ref >60)
Glucose, Bld: 179 mg/dL — ABNORMAL HIGH (ref 70–99)
Potassium: 4.1 mmol/L (ref 3.5–5.1)
Sodium: 134 mmol/L — ABNORMAL LOW (ref 135–145)

## 2018-09-17 NOTE — ED Triage Notes (Signed)
Denies any recent illness.  Felt fine today until about 2 hours ago, started feeling weak.  Denies pain.

## 2018-09-17 NOTE — Discharge Instructions (Signed)
Return if any problems.

## 2018-09-19 NOTE — ED Provider Notes (Signed)
[facility]    CSN: [id number] Arrival date & time: 09/17/18  1736     History   Chief Complaint Chief Complaint  Patient presents with  . Fatigue    HPI Madeline Maynard is a 48 y.o. female.   The history is provided by the patient. No language interpreter was used.  Pt complains  of feeling weak for the past 2 hours.  Pt reports she felt fine until today.  Pt reports she started feeling weak.  Pt denies fever, no cough, no congestion Pt denies nausea or vomiting.  Pt reports she has multiple people in her family who are diabetic.   Past Medical History:  Diagnosis Date  . Missed abortion   . Wears glasses     There are no active problems to display for this patient.   Past Surgical History:  Procedure Laterality Date  . DILATION AND CURETTAGE OF UTERUS  2010  . DILATION AND EVACUATION N/A 07/15/2013   Procedure: DILATATION AND EVACUATION;  Surgeon: [name], MD;  Location: [facility];  Service: Gynecology;  Laterality: N/A;    OB History   No obstetric history on file.      Home Medications    Prior to Admission medications   Medication Sig Start Date End Date Taking? Authorizing Provider  Ascorbic Acid (VITAMIN C) 100 MG tablet Take 100 mg by mouth daily.   Yes [provider]  VITAMIN D PO Take by mouth.   Yes [provider]    Family History Family History  Problem Relation Age of Onset  . Diabetes Sister     Social History Social History   Tobacco Use  . Smoking status: Never Smoker  . Smokeless tobacco: Never Used  Substance Use Topics  . Alcohol use: No  . Drug use: No     Allergies   Sulfa antibiotics   Review of Systems Review of Systems  All other systems reviewed and are negative.    Physical Exam Triage Vital Signs ED Triage Vitals  Enc Vitals Group     BP 09/17/18 1816 (!) 163/94     Pulse Rate 09/17/18 1816 85     Resp 09/17/18 1816 16     Temp 09/17/18 1816  98.7 F (37.1 C)     Temp Source 09/17/18 1816 Oral     SpO2 09/17/18 1816 100 %     Weight --      Height --      Head Circumference --      Peak Flow --      Pain Score 09/17/18 1811 0     Pain Loc --      Pain Edu? --      Excl. in GC? --    No data found.  Updated Vital Signs BP (!) 163/94 (BP Location: Right Arm)   Pulse 85   Temp 98.7 F (37.1 C) (Oral)   Resp 16   LMP 08/24/2018   SpO2 100%   Visual Acuity Right Eye Distance:   Left Eye Distance:   Bilateral Distance:    Right Eye Near:   Left Eye Near:    Bilateral Near:     Physical Exam Vitals signs and nursing note reviewed.  Constitutional:      Appearance: She is well-developed.  HENT:     Head: Normocephalic.     Right Ear: Tympanic membrane normal.     Left Ear: Tympanic membrane normal.  Nose: Nose normal.     Mouth/Throat:     Mouth: Mucous membranes are moist.  Neck:     Musculoskeletal: Normal range of motion.  Cardiovascular:     Rate and Rhythm: Normal rate.     Pulses: Normal pulses.  Pulmonary:     Effort: Pulmonary effort is normal.  Abdominal:     General: Abdomen is flat. There is no distension.  Musculoskeletal: Normal range of motion.  Skin:    General: Skin is warm.  Neurological:     General: No focal deficit present.     Mental Status: She is alert and oriented to person, place, and time.  Psychiatric:        Mood and Affect: Mood normal.      UC Treatments / Results  Labs (all labs ordered are listed, but only abnormal results are displayed) Labs Reviewed  CBC WITH DIFFERENTIAL/PLATELET - Abnormal; Notable for the following components:      Result Value   Hemoglobin 11.3 (*)    HCT 35.8 (*)    MCV 79.9 (*)    MCH 25.2 (*)    All other components within normal limits  BASIC METABOLIC PANEL - Abnormal; Notable for the following components:   Sodium 134 (*)    CO2 21 (*)    Glucose, Bld 179 (*)    Creatinine, Ser 1.02 (*)    All other components within  normal limits    EKG None  Radiology No results found.  Procedures Procedures (including critical care time)  Medications Ordered in UC Medications - No data to display  Initial Impression / Assessment and Plan / UC Course  I have reviewed the triage vital signs and the nursing notes.  Pertinent labs & imaging results that were available during my care of the patient were reviewed by me and considered in my medical decision making (see chart for details).    Pt counseled on findings.  Pt advised to take a multi vitamin with iron for anemia.  Pt has an elevated glucose.  Pt advised to schedule primary care appointment for further evalatuion  Final Clinical Impressions(s) / UC Diagnoses   Final diagnoses:  Hyperglycemia  Anemia, unspecified type     Discharge Instructions     Return if any problems.    ED Prescriptions    None    An After Visit Summary was printed and given to the patient.  Controlled Substance Prescriptions [place] Controlled Substance Registry consulted? Not Applicable   Elson AreasSofia, Homer Pfeifer K, [place] 09/19/18 [phone]

## 2018-09-23 DIAGNOSIS — D649 Anemia, unspecified: Secondary | ICD-10-CM | POA: Diagnosis not present

## 2018-09-23 DIAGNOSIS — R739 Hyperglycemia, unspecified: Secondary | ICD-10-CM | POA: Diagnosis not present

## 2018-09-28 DIAGNOSIS — R739 Hyperglycemia, unspecified: Secondary | ICD-10-CM | POA: Diagnosis not present

## 2018-09-28 DIAGNOSIS — D649 Anemia, unspecified: Secondary | ICD-10-CM | POA: Diagnosis not present

## 2018-09-30 DIAGNOSIS — E1165 Type 2 diabetes mellitus with hyperglycemia: Secondary | ICD-10-CM | POA: Diagnosis not present

## 2018-10-20 DIAGNOSIS — Z1231 Encounter for screening mammogram for malignant neoplasm of breast: Secondary | ICD-10-CM | POA: Diagnosis not present

## 2018-10-20 DIAGNOSIS — Z01419 Encounter for gynecological examination (general) (routine) without abnormal findings: Secondary | ICD-10-CM | POA: Diagnosis not present

## 2018-10-20 DIAGNOSIS — Z6834 Body mass index (BMI) 34.0-34.9, adult: Secondary | ICD-10-CM | POA: Diagnosis not present

## 2018-10-28 DIAGNOSIS — E119 Type 2 diabetes mellitus without complications: Secondary | ICD-10-CM | POA: Diagnosis not present

## 2018-12-16 DIAGNOSIS — E119 Type 2 diabetes mellitus without complications: Secondary | ICD-10-CM | POA: Diagnosis not present

## 2019-01-12 DIAGNOSIS — R42 Dizziness and giddiness: Secondary | ICD-10-CM | POA: Diagnosis not present

## 2019-01-12 DIAGNOSIS — R238 Other skin changes: Secondary | ICD-10-CM | POA: Diagnosis not present

## 2019-01-12 DIAGNOSIS — M79601 Pain in right arm: Secondary | ICD-10-CM | POA: Diagnosis not present

## 2019-01-13 DIAGNOSIS — R238 Other skin changes: Secondary | ICD-10-CM | POA: Diagnosis not present

## 2019-01-13 DIAGNOSIS — R42 Dizziness and giddiness: Secondary | ICD-10-CM | POA: Diagnosis not present

## 2019-02-07 DIAGNOSIS — R51 Headache: Secondary | ICD-10-CM | POA: Diagnosis not present

## 2019-02-07 DIAGNOSIS — R0981 Nasal congestion: Secondary | ICD-10-CM | POA: Diagnosis not present

## 2019-02-07 DIAGNOSIS — R52 Pain, unspecified: Secondary | ICD-10-CM | POA: Diagnosis not present

## 2019-02-07 DIAGNOSIS — Z20828 Contact with and (suspected) exposure to other viral communicable diseases: Secondary | ICD-10-CM | POA: Diagnosis not present

## 2019-02-10 DIAGNOSIS — N920 Excessive and frequent menstruation with regular cycle: Secondary | ICD-10-CM | POA: Diagnosis not present

## 2019-02-10 DIAGNOSIS — R42 Dizziness and giddiness: Secondary | ICD-10-CM | POA: Diagnosis not present

## 2019-02-10 DIAGNOSIS — D649 Anemia, unspecified: Secondary | ICD-10-CM | POA: Diagnosis not present

## 2019-02-16 DIAGNOSIS — N924 Excessive bleeding in the premenopausal period: Secondary | ICD-10-CM | POA: Diagnosis not present

## 2019-03-31 DIAGNOSIS — G4762 Sleep related leg cramps: Secondary | ICD-10-CM | POA: Diagnosis not present

## 2019-03-31 DIAGNOSIS — E559 Vitamin D deficiency, unspecified: Secondary | ICD-10-CM | POA: Diagnosis not present

## 2019-03-31 DIAGNOSIS — Z862 Personal history of diseases of the blood and blood-forming organs and certain disorders involving the immune mechanism: Secondary | ICD-10-CM | POA: Diagnosis not present

## 2019-03-31 DIAGNOSIS — Z23 Encounter for immunization: Secondary | ICD-10-CM | POA: Diagnosis not present

## 2019-03-31 DIAGNOSIS — E119 Type 2 diabetes mellitus without complications: Secondary | ICD-10-CM | POA: Diagnosis not present

## 2019-03-31 DIAGNOSIS — Z0000 Encounter for general adult medical examination without abnormal findings: Secondary | ICD-10-CM | POA: Diagnosis not present

## 2019-04-04 ENCOUNTER — Ambulatory Visit (HOSPITAL_COMMUNITY)
Admission: EM | Admit: 2019-04-04 | Discharge: 2019-04-04 | Disposition: A | Discharge status: Home / Self Care | Payer: BC Managed Care – PPO | Attending: Internal Medicine | Admitting: Internal Medicine

## 2019-04-04 ENCOUNTER — Encounter (HOSPITAL_COMMUNITY): Payer: Self-pay

## 2019-04-04 ENCOUNTER — Other Ambulatory Visit: Payer: Self-pay

## 2019-04-04 DIAGNOSIS — R197 Diarrhea, unspecified: Secondary | ICD-10-CM | POA: Diagnosis not present

## 2019-04-04 LAB — BASIC METABOLIC PANEL
Anion gap: 11 (ref 5–15)
BUN: 8 mg/dL (ref 6–20)
CO2: 23 mmol/L (ref 22–32)
Calcium: 9.2 mg/dL (ref 8.9–10.3)
Chloride: 102 mmol/L (ref 98–111)
Creatinine, Ser: 0.89 mg/dL (ref 0.44–1.00)
GFR calc Af Amer: >60 mL/min (ref >60)
GFR calc non Af Amer: >60 mL/min (ref >60)
Glucose, Bld: 149 mg/dL — ABNORMAL HIGH (ref 70–99)
Potassium: 3.6 mmol/L (ref 3.5–5.1)
Sodium: 136 mmol/L (ref 135–145)

## 2019-04-04 MED ORDER — METFORMIN HCL 500 MG PO TABS: 500.0000 mg | ORAL_TABLET | Freq: Every day | ORAL | Status: DC

## 2019-04-04 NOTE — ED Triage Notes (Signed)
Pt states she has diarrhea and fatigue x 2 days. Pt states she has had the Covid tested done twice and it has came back negative.

## 2019-04-04 NOTE — ED Provider Notes (Signed)
[facility]    CSN: [id number] Arrival date & time: 04/04/19  1020      History   Chief Complaint Chief Complaint  Patient presents with  . Diarrhea    HPI Madeline Maynard is a 48 y.o. female with a history of diabetes mellitus type 2 currently on Metformin 500 mg twice daily comes to urgent care with complaints of crampy abdominal pain and diarrhea over the past several days.  Patient has been on Metformin since April.  She has been taking Metformin without food over the past several days.  Patient has noticed that after she takes Metformin she has a bout of diarrhea.  This is especially so after she eats.  No change in her dietary habits.  No fever or chills.  No nausea vomiting.  She was recently told that her A1c was 5.8.  This morning the patient had shaky sweaty episode and check her blood sugars at that time.  She had a blood sugar of 85 with symptoms.  Symptoms resolved after she ate some banana.  Patient denies any fever or chills.  No cough or sputum production.  No runny nose sore throat, cough, shortness of breath.  No sick contacts.  HPI  Past Medical History:  Diagnosis Date  . Missed abortion   . Wears glasses     There are no active problems to display for this patient.   Past Surgical History:  Procedure Laterality Date  . DILATION AND CURETTAGE OF UTERUS  2010  . DILATION AND EVACUATION N/A 07/15/2013   Procedure: DILATATION AND EVACUATION;  Surgeon: [name], MD;  Location: [facility];  Service: Gynecology;  Laterality: N/A;    OB History   No obstetric history on file.      Home Medications    Prior to Admission medications   Medication Sig Start Date End Date Taking? Authorizing Provider  Ascorbic Acid (VITAMIN C) 100 MG tablet Take 100 mg by mouth daily.    [provider]  metFORMIN (GLUCOPHAGE) 500 MG tablet Take 1 tablet (500 mg total) by mouth daily before breakfast. 04/04/19   Rainbow Salman, Britta Mccreedy,  MD  VITAMIN D PO Take by mouth.    [provider]    Family History Family History  Problem Relation Age of Onset  . Diabetes Sister     Social History Social History   Tobacco Use  . Smoking status: Never Smoker  . Smokeless tobacco: Never Used  Substance Use Topics  . Alcohol use: No  . Drug use: No     Allergies   Sulfa antibiotics   Review of Systems Review of Systems  Constitutional: Negative for activity change, chills, fatigue, fever and unexpected weight change.  HENT: Negative.   Respiratory: Negative.   Gastrointestinal: Positive for diarrhea. Negative for nausea and vomiting.  Genitourinary: Negative.  Negative for enuresis, flank pain, urgency, vaginal bleeding and vaginal discharge.  Musculoskeletal: Negative.   Neurological: Negative.      Physical Exam Triage Vital Signs ED Triage Vitals  Enc Vitals Group     BP 04/04/19 1051 (!) 148/91     Pulse Rate 04/04/19 1051 75     Resp 04/04/19 1051 18     Temp 04/04/19 1051 98.7 F (37.1 C)     Temp src --      SpO2 04/04/19 1051 100 %     Weight 04/04/19 1050 166 lb (75.3 kg)     Height --  Head Circumference --      Peak Flow --      Pain Score 04/04/19 1049 0     Pain Loc --      Pain Edu? --      Excl. in GC? --    No data found.  Updated Vital Signs BP (!) 148/91 (BP Location: Right Arm)   Pulse 75   Temp 98.7 F (37.1 C)   Resp 18   Wt 75.3 kg   LMP 03/24/2019   SpO2 100%   BMI 31.89 kg/m   Visual Acuity Right Eye Distance:   Left Eye Distance:   Bilateral Distance:    Right Eye Near:   Left Eye Near:    Bilateral Near:     Physical Exam Vitals signs and nursing note reviewed.  Constitutional:      Appearance: Normal appearance.  HENT:     Right Ear: Tympanic membrane normal.     Left Ear: Tympanic membrane normal.     Mouth/Throat:     Mouth: Mucous membranes are moist.  Eyes:     Conjunctiva/sclera: Conjunctivae normal.  Cardiovascular:     Rate  and Rhythm: Normal rate and regular rhythm.     Pulses: Normal pulses.     Heart sounds: Normal heart sounds. No murmur. No friction rub.  Pulmonary:     Effort: Pulmonary effort is normal. No respiratory distress.     Breath sounds: Normal breath sounds. No rhonchi or rales.  Abdominal:     General: Bowel sounds are normal. There is no distension.     Palpations: Abdomen is soft.     Tenderness: There is no guarding or rebound.  Musculoskeletal: Normal range of motion.  Skin:    General: Skin is warm.     Capillary Refill: Capillary refill takes less than 2 seconds.     Findings: No bruising or erythema.  Neurological:     General: No focal deficit present.     Mental Status: She is alert and oriented to person, place, and time.      UC Treatments / Results  Labs (all labs ordered are listed, but only abnormal results are displayed) Labs Reviewed  BASIC METABOLIC PANEL - Abnormal; Notable for the following components:      Result Value   Glucose, Bld 149 (*)    All other components within normal limits  CBG MONITORING, ED    EKG   Radiology No results found.  Procedures Procedures (including critical care time)  Medications Ordered in UC Medications - No data to display  Initial Impression / Assessment and Plan / UC Course  I have reviewed the triage vital signs and the nursing notes.  Pertinent labs & imaging results that were available during my care of the patient were reviewed by me and considered in my medical decision making (see chart for details).     1.  Metformin induced diarrhea: Patient is advised to take Metformin with food.  This will decrease the side effect of diarrhea.  If diarrhea persist patient is advised to reach out to the urgent care to be reevaluated.  2.  Hypoglycemia symptoms: Decrease Metformin from 500 mg twice daily to 500 mg daily Hemoglobin A1c is 5.8.  Patient has lost about 30 pounds since she was first diagnosed with diabetes  mellitus type 2.  Patient will follow up with primary care to determine the use of Metformin for blood sugar control.  I suspect that if patient maintains  a healthy diet and continues to lose weight, she will not require oral hypoglycemic agents. Final Clinical Impressions(s) / UC Diagnoses   Final diagnoses:  Diarrhea, unspecified type   Discharge Instructions   None    ED Prescriptions    Medication Sig Dispense Auth. Provider   metFORMIN (GLUCOPHAGE) 500 MG tablet Take 1 tablet (500 mg total) by mouth daily before breakfast.  Parish Augustine, Myrene Galas, MD     PDMP not reviewed this encounter.   Chase Picket, MD 04/04/19 [phone]

## 2019-04-06 ENCOUNTER — Telehealth (HOSPITAL_COMMUNITY): Payer: Self-pay | Admitting: Emergency Medicine

## 2019-04-06 NOTE — Telephone Encounter (Signed)
Pt contacted about BMP results, pt has follow up with her PCP tomorrow. Verbalized understanding, all questions answered.

## 2019-04-07 DIAGNOSIS — Z13220 Encounter for screening for lipoid disorders: Secondary | ICD-10-CM | POA: Diagnosis not present

## 2019-04-07 DIAGNOSIS — E119 Type 2 diabetes mellitus without complications: Secondary | ICD-10-CM | POA: Diagnosis not present

## 2019-04-07 DIAGNOSIS — Z0000 Encounter for general adult medical examination without abnormal findings: Secondary | ICD-10-CM | POA: Diagnosis not present

## 2019-04-16 ENCOUNTER — Ambulatory Visit (HOSPITAL_COMMUNITY)
Admission: EM | Admit: 2019-04-16 | Discharge: 2019-04-16 | Disposition: A | Discharge status: Home / Self Care | Payer: BC Managed Care – PPO | Attending: Urgent Care | Admitting: Urgent Care

## 2019-04-16 ENCOUNTER — Other Ambulatory Visit: Payer: Self-pay

## 2019-04-16 ENCOUNTER — Encounter (HOSPITAL_COMMUNITY): Payer: Self-pay

## 2019-04-16 DIAGNOSIS — F4321 Adjustment disorder with depressed mood: Secondary | ICD-10-CM

## 2019-04-16 DIAGNOSIS — R61 Generalized hyperhidrosis: Secondary | ICD-10-CM

## 2019-04-16 DIAGNOSIS — R000 Tachycardia, unspecified: Secondary | ICD-10-CM

## 2019-04-16 DIAGNOSIS — E119 Type 2 diabetes mellitus without complications: Secondary | ICD-10-CM

## 2019-04-16 DIAGNOSIS — R002 Palpitations: Secondary | ICD-10-CM

## 2019-04-16 LAB — GLUCOSE, CAPILLARY: Glucose-Capillary: 82 mg/dL (ref 70–99)

## 2019-04-16 MED ORDER — ALPRAZOLAM 0.25 MG PO TABS: 0.2500 mg | ORAL_TABLET | Freq: Three times a day (TID) | ORAL | 0 refills | Status: DC | PRN

## 2019-04-16 NOTE — ED Triage Notes (Signed)
Patient presents to Urgent Care with complaints of feeling like her heart was beating really fast while she was driving down the road today. Patient reports her sister's funeral was today and that has been hard for her, pt is sweating profusely during triage, is diabetic.

## 2019-04-16 NOTE — ED Provider Notes (Signed)
[facility]   MRN: [id number] DOB: 04-08-1971  Subjective:   Madeline Maynard is a 48 y.o. female presenting for an episode of heart racing, palpitations and diaphoresis while she was driving today.  Patient unfortunately had to attend her sister's funeral today and this was an unexpected death related to having blood clots.  Patient is diabetic however.   No current facility-administered medications for this encounter.   Current Outpatient Medications:  .  metFORMIN (GLUCOPHAGE) 500 MG tablet, Take 1 tablet (500 mg total) by mouth daily before breakfast., Disp: , Rfl:  .  Ascorbic Acid (VITAMIN C) 100 MG tablet, Take 100 mg by mouth daily., Disp: , Rfl:  .  VITAMIN D PO, Take by mouth., Disp: , Rfl:    Allergies  Allergen Reactions  . Sulfa Antibiotics Hives    Past Medical History:  Diagnosis Date  . Missed abortion   . Wears glasses      Past Surgical History:  Procedure Laterality Date  . DILATION AND CURETTAGE OF UTERUS  2010  . DILATION AND EVACUATION N/A 07/15/2013   Procedure: DILATATION AND EVACUATION;  Surgeon: [name], MD;  Location: [facility];  Service: Gynecology;  Laterality: N/A;    Family History  Problem Relation Age of Onset  . Diabetes Sister     Social History   Tobacco Use  . Smoking status: Never Smoker  . Smokeless tobacco: Never Used  Substance Use Topics  . Alcohol use: No  . Drug use: No    Review of Systems  Constitutional: Positive for diaphoresis. Negative for fever and malaise/fatigue.  HENT: Negative for congestion, ear pain, sinus pain and sore throat.   Eyes: Negative for discharge and redness.  Respiratory: Negative for cough, hemoptysis, shortness of breath and wheezing.   Cardiovascular: Positive for palpitations. Negative for chest pain.  Gastrointestinal: Negative for abdominal pain, diarrhea, nausea and vomiting.  Genitourinary: Negative for dysuria, flank pain and hematuria.   Musculoskeletal: Negative for myalgias.  Skin: Negative for rash.  Neurological: Negative for dizziness, weakness and headaches.  Psychiatric/Behavioral: Positive for depression. Negative for substance abuse. The patient is nervous/anxious.      Objective:   Vitals: BP (!) 149/87 (BP Location: Right Arm)   Pulse 76   Temp 98.1 F (36.7 C) (Oral)   Resp 16   LMP 03/24/2019   SpO2 100%   BP Readings from Last 3 Encounters:  04/16/19 (!) 149/87  04/04/19 (!) 148/91  09/17/18 (!) 163/94   Physical Exam Constitutional:      General: She is not in acute distress.    Appearance: Normal appearance. She is well-developed. She is not ill-appearing, toxic-appearing or diaphoretic.  HENT:     Head: Normocephalic and atraumatic.     Nose: Nose normal.     Mouth/Throat:     Mouth: Mucous membranes are moist.  Eyes:     Extraocular Movements: Extraocular movements intact.     Pupils: Pupils are equal, round, and reactive to light.  Neck:     Musculoskeletal: Normal range of motion and neck supple.  Cardiovascular:     Rate and Rhythm: Normal rate and regular rhythm.     Pulses: Normal pulses.     Heart sounds: Normal heart sounds. No murmur. No friction rub. No gallop.   Pulmonary:     Effort: Pulmonary effort is normal. No respiratory distress.     Breath sounds: Normal breath sounds. No stridor. No wheezing, rhonchi or rales.  Skin:    General: Skin is warm and dry.     Findings: No rash.  Neurological:     Mental Status: She is alert and oriented to person, place, and time.     Cranial Nerves: No cranial nerve deficit.     Motor: No weakness.     Coordination: Coordination normal.     Gait: Gait normal.     Deep Tendon Reflexes: Reflexes normal.  Psychiatric:        Behavior: Behavior normal.        Thought Content: Thought content normal.        Judgment: Judgment normal.     Comments: Flat affect.  Grieving.     Results for orders placed or performed during the  hospital encounter of 04/16/19 (from the past 24 hour(s))  Glucose, capillary     Status: None   Collection Time: 04/16/19  4:11 PM  Result Value Ref Range   Glucose-Capillary 82 70 - 99 mg/dL   ED ECG REPORT   Date: 04/16/2019  Rate: 66 bpm  Rhythm: normal sinus rhythm  QRS Axis: normal  Intervals: normal  ST/T Wave abnormalities: normal  Conduction Disutrbances:none  Narrative Interpretation: Sinus rhythm at 66 bpm. Old EKG Reviewed: none available  I have personally reviewed the EKG tracing and agree with the computerized printout as noted.   Assessment and Plan :   1. Racing heart beat   2. Well controlled diabetes mellitus ([place])   3. Palpitations   4. Diaphoresis   5. Grieving     Physical exam findings, vital signs and EKG, blood sugar reassuring.  Suspect the patient may have had a panic attack from her grieving.  We will have patient use Xanax as needed for this and contact the therapist for grief counseling.  Follow-up with PCP as soon as possible. Counseled patient on potential for adverse effects with medications prescribed/recommended today, ER and return-to-clinic precautions discussed, patient verbalized understanding.    Jaynee Eagles, [place] 04/16/19 [phone]

## 2019-04-20 ENCOUNTER — Encounter (HOSPITAL_COMMUNITY): Payer: Self-pay

## 2019-04-20 ENCOUNTER — Emergency Department (HOSPITAL_COMMUNITY)
Admission: EM | Admit: 2019-04-20 | Discharge: 2019-04-21 | Disposition: A | Discharge status: Home / Self Care | Payer: BC Managed Care – PPO | Attending: Emergency Medicine | Admitting: Emergency Medicine

## 2019-04-20 ENCOUNTER — Other Ambulatory Visit: Payer: Self-pay

## 2019-04-20 DIAGNOSIS — R0602 Shortness of breath: Secondary | ICD-10-CM | POA: Diagnosis not present

## 2019-04-20 DIAGNOSIS — R002 Palpitations: Secondary | ICD-10-CM | POA: Insufficient documentation

## 2019-04-20 DIAGNOSIS — E119 Type 2 diabetes mellitus without complications: Secondary | ICD-10-CM | POA: Diagnosis not present

## 2019-04-20 DIAGNOSIS — R000 Tachycardia, unspecified: Secondary | ICD-10-CM | POA: Diagnosis not present

## 2019-04-20 LAB — BASIC METABOLIC PANEL
Anion gap: 8 (ref 5–15)
BUN: 12 mg/dL (ref 6–20)
CO2: 24 mmol/L (ref 22–32)
Calcium: 9.4 mg/dL (ref 8.9–10.3)
Chloride: 106 mmol/L (ref 98–111)
Creatinine, Ser: 0.97 mg/dL (ref 0.44–1.00)
GFR calc Af Amer: >60 mL/min (ref >60)
GFR calc non Af Amer: >60 mL/min (ref >60)
Glucose, Bld: 147 mg/dL — ABNORMAL HIGH (ref 70–99)
Potassium: 4 mmol/L (ref 3.5–5.1)
Sodium: 138 mmol/L (ref 135–145)

## 2019-04-20 LAB — CBC
HCT: 34.8 % — ABNORMAL LOW (ref 36.0–46.0)
Hemoglobin: 11.4 g/dL — ABNORMAL LOW (ref 12.0–15.0)
MCH: 27.1 pg (ref 26.0–34.0)
MCHC: 32.8 g/dL (ref 30.0–36.0)
MCV: 82.9 fL (ref 80.0–100.0)
Platelets: 245 10*3/uL (ref 150–400)
RBC: 4.2 MIL/uL (ref 3.87–5.11)
RDW: 15 % (ref 11.5–15.5)
WBC: 6.2 10*3/uL (ref 4.0–10.5)
nRBC: 0 % (ref 0.0–0.2)

## 2019-04-20 MED ORDER — SODIUM CHLORIDE 0.9% FLUSH
3.0000 mL | Freq: Once | INTRAVENOUS | Status: AC
  Administered 2019-04-21: 08:00:00 3 mL via INTRAVENOUS

## 2019-04-20 NOTE — ED Triage Notes (Signed)
Pt reports she woke out of her sleep and felt like she couldn't breathe and felt like her heart was racing. Pt denies CP/SOB. Pt a.o, nad noted at this time. Pulse rate 70 in triage.

## 2019-04-21 ENCOUNTER — Emergency Department (HOSPITAL_COMMUNITY): Payer: BC Managed Care – PPO

## 2019-04-21 DIAGNOSIS — R0602 Shortness of breath: Secondary | ICD-10-CM | POA: Diagnosis not present

## 2019-04-21 LAB — BRAIN NATRIURETIC PEPTIDE: B Natriuretic Peptide: 26 pg/mL (ref 0.0–100.0)

## 2019-04-21 LAB — TROPONIN I (HIGH SENSITIVITY)
Troponin I (High Sensitivity): 2 ng/L (ref <18)
Troponin I (High Sensitivity): <2 ng/L (ref <18)

## 2019-04-21 LAB — TSH: TSH: 1.027 u[IU]/mL (ref 0.350–4.500)

## 2019-04-21 LAB — I-STAT BETA HCG BLOOD, ED (MC, WL, AP ONLY): I-stat hCG, quantitative: <5 m[IU]/mL (ref <5)

## 2019-04-21 NOTE — Discharge Instructions (Signed)
You were seen in the emergency department today with heart palpitations.  I have placed a referral to the cardiologist.  Please call today to confirm your appointment and return to the emergency department any new or suddenly worsening symptoms.

## 2019-04-21 NOTE — ED Provider Notes (Signed)
Emergency Department Provider Note   I have reviewed the triage vital signs and the nursing notes.   HISTORY  Chief Complaint Tachycardia   HPI Madeline Maynard is a 48 y.o. female with PMH of DM presents to the emergency department for evaluation of acute onset heart palpitations or shortness of breath which woke her from sleep this morning.  Patient states that this is the second such episode in the past week.  She was seen last week at urgent care with similar heart palpitations.  Patient states that this episode she was asleep and she awoke with these palpitation sensations.  She had shortness of breath without chest pain.  No syncope event after waking up.  She presented to the emergency department but symptoms had abated by that time.  She denies any fevers or chills.  No new medications or medication changes.  No herbal supplements.   Past Medical History:  Diagnosis Date  . Diabetes mellitus without complication (H[facility])   . Missed abortion   . Wears glasses     There are no active problems to display for this patient.   Past Surgical History:  Procedure Laterality Date  . DILATION AND CURETTAGE OF UTERUS  2010  . DILATION AND EVACUATION N/A 07/15/2013   Procedure: DILATATION AND EVACUATION;  Surgeon: [name], MD;  Location: [facility];  Service: Gynecology;  Laterality: N/A;    Allergies Sulfa antibiotics  Family History  Problem Relation Age of Onset  . Diabetes Sister     Social History Social History   Tobacco Use  . Smoking status: Never Smoker  . Smokeless tobacco: Never Used  Substance Use Topics  . Alcohol use: No  . Drug use: No    Review of Systems  Constitutional: No fever/chills Eyes: No visual changes. ENT: No sore throat. Cardiovascular: Denies chest pain. Positive heart palpitations.  Respiratory: Positive shortness of breath. Gastrointestinal: No abdominal pain.  No nausea, no vomiting.  No diarrhea.  No  constipation. Genitourinary: Negative for dysuria. Musculoskeletal: Negative for back pain. Skin: Negative for rash. Neurological: Negative for headaches, focal weakness or numbness.  10-point ROS otherwise negative.  ____________________________________________   PHYSICAL EXAM:  VITAL SIGNS: ED Triage Vitals  Enc Vitals Group     BP 04/20/19 2221 (!) 139/93     Pulse Rate 04/20/19 2221 70     Resp 04/20/19 2221 18     Temp 04/20/19 2221 98.5 F (36.9 C)     Temp Source 04/20/19 2221 Oral     SpO2 04/20/19 2221 100 %   Constitutional: Alert and oriented. Well appearing and in no acute distress. Eyes: Conjunctivae are normal.  Head: Atraumatic. Nose: No congestion/rhinnorhea. Mouth/Throat: Mucous membranes are moist.   Neck: No stridor. Cardiovascular: Normal rate, regular rhythm. Good peripheral circulation. Grossly normal heart sounds.   Respiratory: Normal respiratory effort.  No retractions. Lungs CTAB. Gastrointestinal: Soft and nontender. No distention.  Musculoskeletal: No lower extremity tenderness nor edema.  Neurologic:  Normal speech and language.  Skin:  Skin is warm, dry and intact. No rash noted. ____________________________________________   LABS (all labs ordered are listed, but only abnormal results are displayed)  Labs Reviewed  BASIC METABOLIC PANEL - Abnormal; Notable for the following components:      Result Value   Glucose, Bld 147 (*)    All other components within normal limits  CBC - Abnormal; Notable for the following components:   Hemoglobin 11.4 (*)    HCT  34.8 (*)    All other components within normal limits  BRAIN NATRIURETIC PEPTIDE  TSH  I-STAT BETA HCG BLOOD, ED ([facility], [facility], [facility] ONLY)  TROPONIN I (HIGH SENSITIVITY)  TROPONIN I (HIGH SENSITIVITY)   ____________________________________________  EKG   EKG Interpretation  Date/Time:  Tuesday April 20 2019 22:17:08 EST Ventricular Rate:  72 PR Interval:  150 QRS Duration: 78  QT Interval:  372 QTC Calculation: 407 R Axis:   -16 Text Interpretation: Normal sinus rhythm Left ventricular hypertrophy ( R in aVL , Sokolow-Lyon , Cornell product ) Nonspecific ST abnormality Abnormal ECG Similar to Nov 13th tracing No STEMI Confirmed by Nanda Quinton [phone]) on 04/21/2019 7:38:53 AM       ____________________________________________  RADIOLOGY  Dg Chest Portable 1 View  Result Date: 04/21/2019 CLINICAL DATA:  Shortness of breath EXAM: PORTABLE CHEST 1 VIEW COMPARISON:  08/09/2017 FINDINGS: Cardiac shadow is stable. Tortuous aorta is again noted. The lungs are well aerated bilaterally. No focal infiltrate, effusion or pneumothorax is seen. No bony abnormality is noted. IMPRESSION: No acute abnormality seen. Electronically Signed   By: Inez Catalina M.D.   On: 04/21/2019 08:26    ____________________________________________   PROCEDURES  Procedure(s) performed:   Procedures  None  ____________________________________________   INITIAL IMPRESSION / ASSESSMENT AND PLAN / ED COURSE  Pertinent labs & imaging results that were available during my care of the patient were reviewed by me and considered in my medical decision making (see chart for details).   Patient presents to the emergency department for evaluation of acute onset heart palpitations with shortness of breath.  No chest pain.  No active symptoms at the time of my evaluation or in triage and her EKG was performed.  Patient now on monitor since arriving back to the acute care area.  Initial blood work from triage reviewed including CBC and chemistry with no acute findings.  I have added a TSH, troponin, BNP, and chest x-ray.  Ultimately, patient will likely require referral to cardiology for ambulatory monitoring as this is the second episode she has experienced.   09:30 AM  TSH, BMP, troponin normal.  I have placed a Cardiology referral in [place]. Patient to call and confirm appointment. Discussed ED  return precautions.  ____________________________________________  FINAL CLINICAL IMPRESSION(S) / ED DIAGNOSES  Final diagnoses:  Palpitations    MEDICATIONS GIVEN DURING THIS VISIT:  Medications  sodium chloride flush (NS) 0.9 % injection 3 mL (3 mLs Intravenous Given 04/21/19 0815)    Note:  This document was prepared using Dragon voice recognition software and may include unintentional dictation errors.  Nanda Quinton, MD, [facility] Emergency Medicine    Emonnie Cannady, [facility], MD 04/21/19 [phone]

## 2019-05-22 DIAGNOSIS — Z6835 Body mass index (BMI) 35.0-35.9, adult: Secondary | ICD-10-CM | POA: Diagnosis not present

## 2019-05-22 DIAGNOSIS — Z20828 Contact with and (suspected) exposure to other viral communicable diseases: Secondary | ICD-10-CM | POA: Diagnosis not present

## 2019-05-24 DIAGNOSIS — Z20828 Contact with and (suspected) exposure to other viral communicable diseases: Secondary | ICD-10-CM | POA: Diagnosis not present

## 2019-05-24 DIAGNOSIS — Z6835 Body mass index (BMI) 35.0-35.9, adult: Secondary | ICD-10-CM | POA: Diagnosis not present

## 2019-06-01 DIAGNOSIS — U071 COVID-19: Secondary | ICD-10-CM | POA: Diagnosis not present

## 2019-06-16 ENCOUNTER — Ambulatory Visit: Payer: BC Managed Care – PPO | Admitting: Cardiology

## 2019-06-21 DIAGNOSIS — R011 Cardiac murmur, unspecified: Secondary | ICD-10-CM | POA: Diagnosis not present

## 2019-06-21 DIAGNOSIS — M5432 Sciatica, left side: Secondary | ICD-10-CM | POA: Diagnosis not present

## 2019-06-21 DIAGNOSIS — R0789 Other chest pain: Secondary | ICD-10-CM | POA: Diagnosis not present

## 2019-06-25 ENCOUNTER — Encounter (INDEPENDENT_AMBULATORY_CARE_PROVIDER_SITE_OTHER): Payer: Self-pay

## 2019-06-25 ENCOUNTER — Ambulatory Visit: Payer: BC Managed Care – PPO | Admitting: Cardiology

## 2019-06-25 ENCOUNTER — Encounter: Payer: Self-pay | Admitting: Cardiology

## 2019-06-25 ENCOUNTER — Other Ambulatory Visit: Payer: Self-pay

## 2019-06-25 VITALS — BP 130/88 | HR 75 | Ht <= 58 in | Wt 170.2 lb

## 2019-06-25 DIAGNOSIS — R002 Palpitations: Secondary | ICD-10-CM | POA: Insufficient documentation

## 2019-06-25 DIAGNOSIS — R011 Cardiac murmur, unspecified: Secondary | ICD-10-CM | POA: Diagnosis not present

## 2019-06-25 NOTE — Progress Notes (Signed)
Primary Care Provider: Patient, No Pcp Per Cardiologist: No primary care provider on file. Electrophysiologist:   Clinic Note: Chief Complaint  Patient presents with  . Hospitalization Follow-up    ER visit for palpitations in November 2020  . Palpitations    Rapid heart rate    HPI:    Madeline Maynard is a 49 y.o. female with a PMH notable for DM-2 (with significant family history of diabetes) below who presents today for evaluation of a distant episode of prolonged tachycardia with dizziness and chest pain as well as dyspnea.Madeline Maynard is being seen today at the request of Long, Arlyss Repress, MD who saw her shortly during her most recent [facility] Emergency Department evaluation.  Madeline Maynard was seen presumably via telemedicine by her PCP after her ER visit and referred for this clinic evaluation. She is a native of [place] with a very large family most of her now are in the states.  Recent Hospitalizations:  November 1-2, 2020: Came to [facility] with complaints of crampy abdominal pain and diarrhea over several weeks.  Supposedly this began after she started on Metformin.  It was presumed that this was likely related to started on Metformin being taken without food.  Her dose was reduced to 500 mg daily.  Apparently she had lost about 30 pounds since initial diagnosis with diabetes-2.  April 16, 2019: [facility]-presented with a rapid heart rate, palpitations and diaphoresis that occurred while driving.  Was attending a virtual funeral for her sister who had an unexpected death related to blood clots from COVID-19. ->  Symptoms were thought to be consistent with panic attack from grieving.  She was given as needed Xanax  April 20, 2019: [name] -again came with acute onset of heart palpitations and shortness of breath that awoke her from sleep.  This time was also associated with dyspnea and chest pain.  No syncope  or near syncope.  No other symptoms, and symptom was resolved by the time she arrived to the ER. -->  Cardiology referral placed  Reviewed  CV studies:    The following studies were reviewed today: (if available, images/films reviewed: From Epic Chart or Care Everywhere) . None:   Interval History:   Madeline Maynard is finally here now for her referral that was placed in November.  She tells me that she really has not had any further symptoms since the 2 episodes in November of the prolonged fast heart rate spells.  She has noted some occasional skipped beats, but nothing lasting more than a couple seconds. She does relate a story of first episode of being related to probably grief and being upset at the fact that she was unable to go to be with her family for her sister's funeral.  The first episode was right at that time, the next was acute few days later it woke her from sleep. Asked what she describes as a very fast heart rate that was pretty much regular.  It woke her from sleep the second time around lasting about 15 to 20 minutes.  She felt dizzy lightheaded with some chest discomfort and shortness of breath.  She tried taking deep breaths and it did spontaneously resolve before she was able to get to the emergency room.  Since that episode, she really has had no further symptoms of rapid heartbeats.  She has not had any further chest pain or dyspnea even with rest or exertion.  CV Review of Symptoms (Summary) Cardiovascular ROS: no chest pain or dyspnea on exertion positive for - rapid heart rate and Rapid heartbeat episodes noted with the second 1 being more associated with chest discomfort and dyspnea as well as dizziness.  None since November 2020 negative for - edema, irregular heartbeat, loss of consciousness, orthopnea, paroxysmal nocturnal dyspnea or Baseline shortness of breath,/near syncope or TIA/amaurosis fugax.  Claudication.  The patient does not have symptoms  concerning for COVID-19 infection (fever, chills, cough, or new shortness of breath).  The patient is practicing social distancing & Masking.    REVIEWED OF SYSTEMS   A comprehensive ROS was performed. Review of Systems  Constitutional: Negative for malaise/fatigue and weight loss.  HENT: Negative for congestion and nosebleeds.   Respiratory: Negative for hemoptysis and wheezing.   Gastrointestinal: Positive for heartburn. Negative for blood in stool and melena.       If she takes her Metformin with food she will get loose stool.  Genitourinary: Negative for frequency and hematuria.  Musculoskeletal: Positive for myalgias (She has some intermittent left upper thigh and buttock pain). Negative for falls and joint pain.  Neurological: Positive for dizziness (Only during the tachycardia spells). Negative for focal weakness and headaches.  Psychiatric/Behavioral: Negative for depression and memory loss. The patient is not nervous/anxious and does not have insomnia.        She seems to have completed her grieving.  After her sisters death.  Seems to be coping well now.  All other systems reviewed and are negative.    I have reviewed and (if needed) personally updated the patient's problem list, medications, allergies, past medical and surgical history, social and family history.   PAST MEDICAL HISTORY   Past Medical History:  Diagnosis Date  . Controlled diabetes mellitus type II without complication ([place])   . Missed abortion   . Palpitations   . Wears glasses      PAST SURGICAL HISTORY   Past Surgical History:  Procedure Laterality Date  . DILATION AND CURETTAGE OF UTERUS  2010  . DILATION AND EVACUATION N/A 07/15/2013   Procedure: DILATATION AND EVACUATION;  Surgeon: Margarette Asal, MD;  Location: [facility];  Service: Gynecology;  Laterality: N/A;     MEDICATIONS/ALLERGIES   Current Meds  Medication Sig  . Ascorbic Acid (VITAMIN C) 100 MG tablet Take  100 mg by mouth daily.  . metFORMIN (GLUCOPHAGE) 500 MG tablet Take 1 tablet (500 mg total) by mouth daily before breakfast.  . VITAMIN D PO Take by mouth.    Allergies  Allergen Reactions  . Sulfa Antibiotics Hives     SOCIAL HISTORY/FAMILY HISTORY   Social History   Tobacco Use  . Smoking status: Never Smoker  . Smokeless tobacco: Never Used  Substance Use Topics  . Alcohol use: No  . Drug use: No   Social History   Social History Narrative   She has been married to her husband for 7 months.  She has 3 children who all live with her.   She walks 4 minutes a day..      She comes a very large family, originally from [place].  She had 17 total siblings 8 from the same mother all from the same father.    Family History family history includes Diabetes in her father and sister; Sudden death (age of onset: 14) in her mother.  She has a total of 17 siblings, 8 from the same mother.  One  of her sisters recently passed in November 2020 complications of Covid. 1 brother and 2 sisters have diabetes.   OBJCTIVE -PE, EKG, labs   Wt Readings from Last 3 Encounters:  06/25/19 170 lb 3.2 oz (77.2 kg)  04/04/19 166 lb (75.3 kg)  08/09/17 192 lb 6.4 oz (87.3 kg)    Physical Exam: BP 130/88   Pulse 75   Ht 4\' 9"  (1.448 m)   Wt 170 lb 3.2 oz (77.2 kg)   SpO2 99%   BMI 36.83 kg/m  Physical Exam  Constitutional: She is oriented to person, place, and time. She appears well-developed and well-nourished. No distress.  Healthy-appearing.  Well-groomed  HENT:  Head: Normocephalic and atraumatic.  Neck: No hepatojugular reflux and no JVD present. Carotid bruit is not present. No tracheal deviation present.  Cardiovascular: Normal rate, regular rhythm and intact distal pulses.  No extrasystoles are present. PMI is not displaced. Exam reveals no gallop and no friction rub.  Murmur (1-2/6 SEM at RUSB.) heard. Pulmonary/Chest: Effort normal and breath sounds normal. No respiratory  distress. She has no wheezes. She has no rales.  Musculoskeletal:        General: No edema. Normal range of motion.     Cervical back: Normal range of motion and neck supple.  Neurological: She is alert and oriented to person, place, and time.  Psychiatric: She has a normal mood and affect. Her behavior is normal. Judgment and thought content normal.  Vitals reviewed.   Adult ECG Report  n/a  Recent Labs:   Lab Results  Component Value Date   TSH 1.027 04/21/2019   Lab Results  Component Value Date   CREATININE 0.97 04/20/2019   BUN 12 04/20/2019   NA 138 04/20/2019   K 4.0 04/20/2019   CL 106 04/20/2019   CO2 24 04/20/2019    ASSESSMENT/PLAN    Problem List Items Addressed This Visit    Rapid palpitations    Total 2 episodes of fast heart rate spells probably related to panic attack and mourning the loss of her sister.  She has not had any further symptoms since that timeframe.  I do not know how much she would find on her monitor.  I recommended that she look into purchasing the KardiaMobile by [employer] for monitoring heart rate/rhythm at home.      Heart murmur    Sounds like aortic sclerosis murmur, however with palpitation episodes and her heritage, would want to exclude significant valvular disease. Plan: Check 2D echo.      Relevant Orders   ECHOCARDIOGRAM COMPLETE       COVID-19 Education: The signs and symptoms of COVID-19 were discussed with the patient and how to seek care for testing (follow up with PCP or arrange E-visit).   The importance of social distancing was discussed today.  I spent a total of 18 minutes with the patient. >  50% of the time was spent in direct patient consultation.  Additional time spent with chart review (studies, outside notes, etc): 10 Total Time: 28 min   Current medicines are reviewed at length with the patient today.  (+/- concerns) none   Patient Instructions / Medication Changes & Studies & Tests Ordered    Patient Instructions  Medication Instructions:  Your physician recommends that you continue on your current medications as directed. Please refer to the Current Medication list given to you today.  *If you need a refill on your cardiac medications before your next appointment, please call  your pharmacy*  Testing/Procedures: Your physician has requested that you have an echocardiogram. Echocardiography is a painless test that uses sound waves to create images of your heart. It provides your doctor with information about the size and shape of your heart and how well your heart's chambers and valves are working. This procedure takes approximately one hour. There are no restrictions for this procedure. -- done at [address] - [address]  Follow-Up: At [facility], you and your health needs are our priority.  As part of our continuing mission to provide you with exceptional heart care, we have created designated Provider Care Teams.  These Care Teams include your primary Cardiologist (physician) and Advanced Practice Providers (APPs -  Physician Assistants and Nurse Practitioners) who all work together to provide you with the care you need, when you need it.  Your next appointment:   1 month(s) after echo ** if echo is normal - follow up will be in September 2021  The format for your next appointment:   In Person  Provider:   Bryan Lemma, MD  Other Instructions  Dr. Herbie Baltimore recommends K[employer]Mobile by [employer] for monitoring heart rate/rhythm at home    Studies Ordered:   Orders Placed This Encounter  Procedures  . ECHOCARDIOGRAM COMPLETE     Bryan Lemma, M.D., M.S. Interventional Cardiologist   Pager # [phone] Phone # [phone] [address] [place], [place] [address]   Thank you for choosing [facility] at [facility]!!

## 2019-06-25 NOTE — Patient Instructions (Addendum)
Medication Instructions:  Your physician recommends that you continue on your current medications as directed. Please refer to the Current Medication list given to you today.  *If you need a refill on your cardiac medications before your next appointment, please call your pharmacy*  Testing/Procedures: Your physician has requested that you have an echocardiogram. Echocardiography is a painless test that uses sound waves to create images of your heart. It provides your doctor with information about the size and shape of your heart and how well your heart's chambers and valves are working. This procedure takes approximately one hour. There are no restrictions for this procedure. -- done at [address] - [address]  Follow-Up: At [facility], you and your health needs are our priority.  As part of our continuing mission to provide you with exceptional heart care, we have created designated Provider Care Teams.  These Care Teams include your primary Cardiologist (physician) and Advanced Practice Providers (APPs -  Physician Assistants and Nurse Practitioners) who all work together to provide you with the care you need, when you need it.  Your next appointment:   1 month(s) after echo ** if echo is normal - follow up will be in September 2021  The format for your next appointment:   In Person  Provider:   Bryan Lemma, MD  Other Instructions  Dr. Herbie Baltimore recommends [employer] by [employer] for monitoring heart rate/rhythm at home

## 2019-06-26 ENCOUNTER — Encounter: Payer: Self-pay | Admitting: Cardiology

## 2019-06-26 NOTE — Assessment & Plan Note (Signed)
Sounds like aortic sclerosis murmur, however with palpitation episodes and her heritage, would want to exclude significant valvular disease. Plan: Check 2D echo.

## 2019-06-26 NOTE — Assessment & Plan Note (Addendum)
Total 2 episodes of fast heart rate spells probably related to panic attack and mourning the loss of her sister.  She has not had any further symptoms since that timeframe.  I do not know how much she would find on her monitor.  I recommended that she look into purchasing the [employer] for monitoring heart rate/rhythm at home.  As the symptoms sound like they could very well be related to PSVT, I also discussed vagal maneuvers with her.

## 2019-07-05 ENCOUNTER — Other Ambulatory Visit: Payer: Self-pay

## 2019-07-05 ENCOUNTER — Ambulatory Visit (HOSPITAL_COMMUNITY): Payer: BC Managed Care – PPO | Attending: Cardiology

## 2019-07-05 DIAGNOSIS — R011 Cardiac murmur, unspecified: Secondary | ICD-10-CM | POA: Insufficient documentation

## 2019-07-06 ENCOUNTER — Telehealth: Payer: Self-pay

## 2019-07-06 NOTE — Telephone Encounter (Signed)
Follow up ° °Patient returning call. Please give patient a call back.  °

## 2019-07-06 NOTE — Telephone Encounter (Signed)
-----   Message from Marykay Lex, MD sent at 07/05/2019 11:23 PM EST ----- Echocardiogram result:  Normal pump function with ejection fraction at the high end of normal which is 65 to 70%.  Normal wall motion.  Normal atriae.  Normal valves.  Nothing to explain murmur.  With normal valves and normal function, there is nothing to explain abnormal heart rhythm or fast heart rate.  Bryan Lemma, MD

## 2019-08-12 ENCOUNTER — Ambulatory Visit: Payer: BC Managed Care – PPO | Admitting: Cardiology

## 2019-08-24 ENCOUNTER — Encounter: Payer: Self-pay | Admitting: Cardiology

## 2019-10-27 DIAGNOSIS — E559 Vitamin D deficiency, unspecified: Secondary | ICD-10-CM | POA: Diagnosis not present

## 2019-10-27 DIAGNOSIS — Z111 Encounter for screening for respiratory tuberculosis: Secondary | ICD-10-CM | POA: Diagnosis not present

## 2019-10-27 DIAGNOSIS — E119 Type 2 diabetes mellitus without complications: Secondary | ICD-10-CM | POA: Diagnosis not present

## 2019-10-27 DIAGNOSIS — Z862 Personal history of diseases of the blood and blood-forming organs and certain disorders involving the immune mechanism: Secondary | ICD-10-CM | POA: Diagnosis not present

## 2019-11-23 DIAGNOSIS — R55 Syncope and collapse: Secondary | ICD-10-CM | POA: Diagnosis not present

## 2019-11-23 DIAGNOSIS — R011 Cardiac murmur, unspecified: Secondary | ICD-10-CM | POA: Diagnosis not present

## 2019-12-11 ENCOUNTER — Encounter: Payer: Self-pay | Admitting: Intensive Care

## 2019-12-11 ENCOUNTER — Other Ambulatory Visit: Payer: Self-pay

## 2019-12-11 DIAGNOSIS — R0602 Shortness of breath: Secondary | ICD-10-CM | POA: Insufficient documentation

## 2019-12-11 DIAGNOSIS — E162 Hypoglycemia, unspecified: Secondary | ICD-10-CM | POA: Insufficient documentation

## 2019-12-11 DIAGNOSIS — E11649 Type 2 diabetes mellitus with hypoglycemia without coma: Secondary | ICD-10-CM | POA: Diagnosis not present

## 2019-12-11 DIAGNOSIS — R42 Dizziness and giddiness: Secondary | ICD-10-CM | POA: Diagnosis not present

## 2019-12-11 DIAGNOSIS — E119 Type 2 diabetes mellitus without complications: Secondary | ICD-10-CM | POA: Diagnosis not present

## 2019-12-11 DIAGNOSIS — Z7984 Long term (current) use of oral hypoglycemic drugs: Secondary | ICD-10-CM | POA: Diagnosis not present

## 2019-12-11 DIAGNOSIS — R531 Weakness: Secondary | ICD-10-CM | POA: Diagnosis not present

## 2019-12-11 DIAGNOSIS — R519 Headache, unspecified: Secondary | ICD-10-CM | POA: Diagnosis not present

## 2019-12-11 LAB — GLUCOSE, CAPILLARY: Glucose-Capillary: 126 mg/dL — ABNORMAL HIGH (ref 70–99)

## 2019-12-11 LAB — CBC
HCT: 36.5 % (ref 36.0–46.0)
Hemoglobin: 11.8 g/dL — ABNORMAL LOW (ref 12.0–15.0)
MCH: 26.6 pg (ref 26.0–34.0)
MCHC: 32.3 g/dL (ref 30.0–36.0)
MCV: 82.4 fL (ref 80.0–100.0)
Platelets: 227 10*3/uL (ref 150–400)
RBC: 4.43 MIL/uL (ref 3.87–5.11)
RDW: 15.1 % (ref 11.5–15.5)
WBC: 5.1 10*3/uL (ref 4.0–10.5)
nRBC: 0 % (ref 0.0–0.2)

## 2019-12-11 LAB — BASIC METABOLIC PANEL
Anion gap: 8 (ref 5–15)
BUN: 17 mg/dL (ref 6–20)
CO2: 21 mmol/L — ABNORMAL LOW (ref 22–32)
Calcium: 9 mg/dL (ref 8.9–10.3)
Chloride: 105 mmol/L (ref 98–111)
Creatinine, Ser: 0.9 mg/dL (ref 0.44–1.00)
GFR calc Af Amer: >60 mL/min (ref >60)
GFR calc non Af Amer: >60 mL/min (ref >60)
Glucose, Bld: 145 mg/dL — ABNORMAL HIGH (ref 70–99)
Potassium: 4 mmol/L (ref 3.5–5.1)
Sodium: 134 mmol/L — ABNORMAL LOW (ref 135–145)

## 2019-12-11 NOTE — ED Triage Notes (Signed)
FIRST NURSE: Pt arrives to ER via EMS dizziness, headache, blurred vision, for last two hours, labile BS for several weeks.

## 2019-12-11 NOTE — ED Triage Notes (Signed)
Patient c/o weakness and sob X2 hours ago. Arrived by EMS from home. HX diabetes and takes metformin

## 2019-12-11 NOTE — ED Notes (Signed)
Pt reports feeling better, no distress noted at this time. Updated on wait time, informed her there were still 3 people ahead of her and that I did not know what time she would be seen. Pt verbalized understanding.

## 2019-12-12 ENCOUNTER — Emergency Department: Payer: BC Managed Care – PPO

## 2019-12-12 ENCOUNTER — Emergency Department
Admission: EM | Admit: 2019-12-12 | Discharge: 2019-12-12 | Disposition: A | Discharge status: Home / Self Care | Payer: BC Managed Care – PPO | Attending: Emergency Medicine | Admitting: Emergency Medicine

## 2019-12-12 DIAGNOSIS — E11649 Type 2 diabetes mellitus with hypoglycemia without coma: Secondary | ICD-10-CM | POA: Diagnosis not present

## 2019-12-12 DIAGNOSIS — R42 Dizziness and giddiness: Secondary | ICD-10-CM | POA: Diagnosis not present

## 2019-12-12 DIAGNOSIS — E162 Hypoglycemia, unspecified: Secondary | ICD-10-CM

## 2019-12-12 DIAGNOSIS — R519 Headache, unspecified: Secondary | ICD-10-CM | POA: Diagnosis not present

## 2019-12-12 LAB — URINALYSIS, COMPLETE (UACMP) WITH MICROSCOPIC
Bilirubin Urine: NEGATIVE
Glucose, UA: NEGATIVE mg/dL
Ketones, ur: NEGATIVE mg/dL
Leukocytes,Ua: NEGATIVE
Nitrite: NEGATIVE
Protein, ur: NEGATIVE mg/dL
Specific Gravity, Urine: 1.009 (ref 1.005–1.030)
pH: 6 (ref 5.0–8.0)

## 2019-12-12 LAB — GLUCOSE, CAPILLARY
Glucose-Capillary: 57 mg/dL — ABNORMAL LOW (ref 70–99)
Glucose-Capillary: 109 mg/dL — ABNORMAL HIGH (ref 70–99)
Glucose-Capillary: 162 mg/dL — ABNORMAL HIGH (ref 70–99)

## 2019-12-12 LAB — POCT PREGNANCY, URINE: Preg Test, Ur: NEGATIVE

## 2019-12-12 LAB — TROPONIN I (HIGH SENSITIVITY): Troponin I (High Sensitivity): 4 ng/L (ref <18)

## 2019-12-12 NOTE — ED Notes (Signed)
OJ, graham crackers, peanut butter provided

## 2019-12-12 NOTE — ED Provider Notes (Signed)
[facility] Emergency Department Provider Note  ____________________________________________  Time seen: Approximately 1:03 AM  I have reviewed the triage vital signs and the nursing notes.   HISTORY  Chief Complaint Weakness and Shortness of Breath   HPI Madeline Maynard is a 49 y.o. female the history of diabetes, palpitations who presents for evaluation of weakness.  Patient reports being in her usual state of health until after lunch this afternoon.  She took her Metformin with breakfast.  Had breakfast and lunch.  After that she started feeling very weak, clammy, has shortness of breath and felt lightheaded.  Her symptoms lasted about an hour and a half.  She check her blood glucose which was 78.  At this time she feels back to baseline and has no complaints.  No chest pain, no abdominal pain, no dysuria, no vomiting, no diarrhea, no fever, no cough.   Past Medical History:  Diagnosis Date  . Controlled diabetes mellitus type II without complication (HCC)   . Missed abortion   . Palpitations   . Wears glasses     Patient Active Problem List   Diagnosis Date Noted  . Rapid palpitations 06/25/2019  . Heart murmur 06/25/2019    Past Surgical History:  Procedure Laterality Date  . DILATION AND CURETTAGE OF UTERUS  2010  . DILATION AND EVACUATION N/A 07/15/2013   Procedure: DILATATION AND EVACUATION;  Surgeon: [name], MD;  Location: [facility];  Service: Gynecology;  Laterality: N/A;    Prior to Admission medications   Medication Sig Start Date End Date Taking? Authorizing Provider  Ascorbic Acid (VITAMIN C) 100 MG tablet Take 100 mg by mouth daily.    [provider]  metFORMIN (GLUCOPHAGE) 500 MG tablet Take 1 tablet (500 mg total) by mouth daily before breakfast. 04/04/19   Lamptey, Britta Mccreedy, MD  VITAMIN D PO Take by mouth.    [provider]    Allergies Sulfa antibiotics  Family History    Problem Relation Age of Onset  . Diabetes Sister   . Sudden death Mother 61       While living in [place], was walking home, stated that she did not feel well and collapsed dead  . Diabetes Father     Social History Social History   Tobacco Use  . Smoking status: Never Smoker  . Smokeless tobacco: Never Used  Vaping Use  . Vaping Use: Never used  Substance Use Topics  . Alcohol use: No  . Drug use: No    Review of Systems  Constitutional: Negative for fever. + Clammy, lightheaded, generalized weakness Eyes: Negative for visual changes. ENT: Negative for sore throat. Neck: No neck pain  Cardiovascular: Negative for chest pain. Respiratory: + shortness of breath. Gastrointestinal: Negative for abdominal pain, vomiting or diarrhea. Genitourinary: Negative for dysuria. Musculoskeletal: Negative for back pain. Skin: Negative for rash. Neurological: Negative for headaches, weakness or numbness. Psych: No SI or HI  ____________________________________________   PHYSICAL EXAM:  VITAL SIGNS: Vitals:   12/11/19 2119 12/12/19 0217  BP: 140/84 129/87  Pulse: (!) 55 83  Resp: 18 16  Temp:    SpO2: 100% 99%    Constitutional: Alert and oriented. Well appearing and in no apparent distress. HEENT:      Head: Normocephalic and atraumatic.         Eyes: Conjunctivae are normal. Sclera is non-icteric.       Mouth/Throat: Mucous membranes are moist.  Neck: Supple with no signs of meningismus. Cardiovascular: Regular rate and rhythm. No murmurs, gallops, or rubs. Respiratory: Normal respiratory effort. Lungs are clear to auscultation bilaterally.  Gastrointestinal: Soft, non tender. Musculoskeletal: No edema, cyanosis, or erythema of extremities. Neurologic: Normal speech and language. Face is symmetric. Moving all extremities. No gross focal neurologic deficits are appreciated. Skin: Skin is warm, dry and intact. No rash noted. Psychiatric: Mood and affect are normal.  Speech and behavior are normal.  ____________________________________________   LABS (all labs ordered are listed, but only abnormal results are displayed)  Labs Reviewed  BASIC METABOLIC PANEL - Abnormal; Notable for the following components:      Result Value   Sodium 134 (*)    CO2 21 (*)    Glucose, Bld 145 (*)    All other components within normal limits  CBC - Abnormal; Notable for the following components:   Hemoglobin 11.8 (*)    All other components within normal limits  URINALYSIS, COMPLETE (UACMP) WITH MICROSCOPIC - Abnormal; Notable for the following components:   Color, Urine STRAW (*)    APPearance CLEAR (*)    Hgb urine dipstick SMALL (*)    Bacteria, UA RARE (*)    All other components within normal limits  GLUCOSE, CAPILLARY - Abnormal; Notable for the following components:   Glucose-Capillary 126 (*)    All other components within normal limits  GLUCOSE, CAPILLARY - Abnormal; Notable for the following components:   Glucose-Capillary 57 (*)    All other components within normal limits  GLUCOSE, CAPILLARY - Abnormal; Notable for the following components:   Glucose-Capillary 109 (*)    All other components within normal limits  GLUCOSE, CAPILLARY - Abnormal; Notable for the following components:   Glucose-Capillary 162 (*)    All other components within normal limits  CBG MONITORING, ED  POC URINE PREG, ED  POCT PREGNANCY, URINE  CBG MONITORING, ED  TROPONIN I (HIGH SENSITIVITY)   ____________________________________________  EKG  ED ECG REPORT I, Nita Sickle, the attending physician, personally viewed and interpreted this ECG.  Normal sinus rhythm, rate of 67, normal intervals, normal axis, no ST elevations or depressions.  Normal EKG. ____________________________________________  RADIOLOGY  I have personally reviewed the images performed during this visit and I agree with the Radiologist's read.   Interpretation by Radiologist:  DG Chest  2 View  Result Date: 12/12/2019 CLINICAL DATA:  Shortness of breath, dizziness and headache. EXAM: CHEST - 2 VIEW COMPARISON:  April 21, 2019 FINDINGS: There is no evidence of acute infiltrate, pleural effusion or pneumothorax. The heart size and mediastinal contours are within normal limits. There is tortuosity of the descending thoracic aorta. The visualized skeletal structures are unremarkable. IMPRESSION: No active cardiopulmonary disease. Electronically Signed   By: Aram Candela M.D.   On: 12/12/2019 01:14      ____________________________________________   PROCEDURES  Procedure(s) performed:yes .1-3 Lead EKG Interpretation Performed by: Nita Sickle, MD Authorized by: Nita Sickle, MD     Interpretation: normal     ECG rate assessment: normal     Rhythm: sinus rhythm     Ectopy: none     Critical Care performed:  None ____________________________________________   INITIAL IMPRESSION / ASSESSMENT AND PLAN / ED COURSE  49 y.o. female the history of diabetes, palpitations who presents for evaluation of weakness, clammy, lightheadedness, shortness of breath that lasted for 1.5 hours.  Patient feels back to baseline.  Unfortunately she has been in the waiting room for 9  hours but her labs look all within normal limits with no signs of hypo or significant hyperglycemia, no electrolyte derangements, no leukocytosis, and stable mild anemia.  EKG showing no dysrhythmias or ischemia.  Pregnancy test negative.  UA is pending to rule out UTI.  Chest x-ray is pending.  Patient placed on telemetry for close monitoring.  We will repeat a CBG.  Patient reports having a similar episode a month ago which was the day before having her menstrual period.  She reports that her period started today.  Old medical records have been reviewed.  Low suspicion for ACS based on description of symptoms.  But troponin is pending for further stratification.  Possible brief episode of  hypoglycemia.  Low suspicion for infection based on full resolution of the symptoms.  Also low suspicion for PE based on full resolution of symptoms, no tachycardia, no tachypnea, no hypoxia.  History gathered from patient and husband was at bedside.  Plan discussed with both of them.     _________________________ 3:05 AM on 12/12/2019 -----------------------------------------  Repeat CBG showing hypoglycemia with glucose of 57.  This is most likely etiology of patient's symptoms at home.  Patient was given food and blood glucose was rechecked twice every hour with results of 109 and 162.  Patient remained stable with no further symptoms.  Troponin negative.  UA negative for infection.  Chest x-ray visualized by me negative, confirmed by radiology.  Recommended decreasing Metformin from 500 twice daily to 500 daily and calling her doctor Monday morning for an adjustment in her medication.  Recommended checking her sugars a couple times a day..  Discussed the importance of taking Metformin with meals.  Discussed in a return precautions for hyperglycemia or hypoglycemia.  Plan and results discussed with patient and her husband was at bedside.  _____________________________________________ Please note:  Patient was evaluated in Emergency Department today for the symptoms described in the history of present illness. Patient was evaluated in the context of the global COVID-19 pandemic, which necessitated consideration that the patient might be at risk for infection with the SARS-CoV-2 virus that causes COVID-19. Institutional protocols and algorithms that pertain to the evaluation of patients at risk for COVID-19 are in a state of rapid change based on information released by regulatory bodies including the CDC and federal and state organizations. These policies and algorithms were followed during the patient's care in the ED.  Some ED evaluations and interventions may be delayed as a result of limited staffing  during the pandemic.   [place] Controlled Substance Database was reviewed by me. ____________________________________________   FINAL CLINICAL IMPRESSION(S) / ED DIAGNOSES   Final diagnoses:  Hypoglycemia      NEW MEDICATIONS STARTED DURING THIS VISIT:  ED Discharge Orders    None       Note:  This document was prepared using Dragon voice recognition software and may include unintentional dictation errors.    Don Perking, [place], MD 12/12/19 [phone]

## 2019-12-12 NOTE — ED Notes (Signed)
Reviewed discharge instructions, follow-up care, and prescription with patient. Patient verbalized understanding of all information reviewed. Patient stable, with no distress noted at this time.

## 2019-12-12 NOTE — Discharge Instructions (Addendum)
Decrease your Metformin to 500 mg once a day and call your doctor on Monday for any adjustments of your medication.  Check your sugars frequently at home.  Return to the emergency room for sugars below 70 or above 400.

## 2019-12-14 DIAGNOSIS — E162 Hypoglycemia, unspecified: Secondary | ICD-10-CM | POA: Diagnosis not present

## 2019-12-15 ENCOUNTER — Encounter (HOSPITAL_COMMUNITY): Payer: Self-pay

## 2019-12-15 ENCOUNTER — Ambulatory Visit (HOSPITAL_COMMUNITY)
Admission: EM | Admit: 2019-12-15 | Discharge: 2019-12-15 | Disposition: A | Discharge status: Home / Self Care | Payer: BC Managed Care – PPO

## 2019-12-15 DIAGNOSIS — R5383 Other fatigue: Secondary | ICD-10-CM

## 2019-12-15 NOTE — ED Triage Notes (Signed)
Patient presents here today with complaints of weakness that began upon waking up this morning. Patient states her blood glucose has been fluctuating  - was seen in the [facility] on Saturday. Patient states her blood glucose was 106 this morning - she has only had a Glucerna drink as she was heading to urgent care.

## 2019-12-15 NOTE — Discharge Instructions (Addendum)
Believe that your symptoms related to low blood sugars or fluctuating blood sugars.   Nothing concerning on exam today. Recommend follow-up with your doctor for further management of your diabetes and possible discontinuation of the Metformin.  Make sure you are eating small meals to keep your blood sugars up Check your blood sugars regularly. Keep eating a healthy diet and exercising as you have been Follow up as needed for continued or worsening symptoms

## 2019-12-16 NOTE — ED Provider Notes (Signed)
[facility]    CSN: [id number] Arrival date & time: 12/15/19  4944      History   Chief Complaint Chief Complaint  Patient presents with  . Fatigue    HPI Madeline Maynard is a 49 y.o. female.   Patient is a 49 year old female with past medical history of diabetes, well controlled.  She presents today with fatigue.  She has had weakness and fatigue that is been off and on for a month or so.  Had episode this morning upon waking.  Reporting her blood glucose has been fluctuating and was seen in the ER on Saturday due to low blood sugars.  Reporting glucose of 106 this morning.  She did have a Glucerna drink on the way to urgent care.  Otherwise she has been walking 3 times a day and eating a very healthy diet.  She states her primary care doctor decreased her Metformin to 250 twice daily.  Last hemoglobin A1c was within normal limits.  Denies any abdominal pain, nausea, vomiting, diarrhea, fevers, chills, body aches, cough.  No headache, blurred vision, chest pain or shortness of breath.  ROS per HPI      Past Medical History:  Diagnosis Date  . Controlled diabetes mellitus type II without complication (HCC)   . Missed abortion   . Palpitations   . Wears glasses     Patient Active Problem List   Diagnosis Date Noted  . Rapid palpitations 06/25/2019  . Heart murmur 06/25/2019    Past Surgical History:  Procedure Laterality Date  . DILATION AND CURETTAGE OF UTERUS  2010  . DILATION AND EVACUATION N/A 07/15/2013   Procedure: DILATATION AND EVACUATION;  Surgeon: [name], MD;  Location: [facility];  Service: Gynecology;  Laterality: N/A;    OB History   No obstetric history on file.      Home Medications    Prior to Admission medications   Medication Sig Start Date End Date Taking? Authorizing Provider  Ascorbic Acid (VITAMIN C) 100 MG tablet Take 100 mg by mouth daily.    [provider]  metFORMIN (GLUCOPHAGE) 500  MG tablet Take 1 tablet (500 mg total) by mouth daily before breakfast. 04/04/19   Lamptey, Britta Mccreedy, MD  VITAMIN D PO Take by mouth.    [provider]    Family History Family History  Problem Relation Age of Onset  . Diabetes Sister   . Sudden death Mother 27       While living in [place], was walking home, stated that she did not feel well and collapsed dead  . Diabetes Father     Social History Social History   Tobacco Use  . Smoking status: Never Smoker  . Smokeless tobacco: Never Used  Vaping Use  . Vaping Use: Never used  Substance Use Topics  . Alcohol use: No  . Drug use: No     Allergies   Sulfa antibiotics   Review of Systems Review of Systems   Physical Exam Triage Vital Signs ED Triage Vitals  Enc Vitals Group     BP 12/15/19 0858 136/87     Pulse Rate 12/15/19 0858 68     Resp 12/15/19 0858 18     Temp 12/15/19 0858 98.3 F (36.8 C)     Temp Source 12/15/19 0858 Oral     SpO2 12/15/19 0858 100 %     Weight --      Height --  Head Circumference --      Peak Flow --      Pain Score 12/15/19 0901 0     Pain Loc --      Pain Edu? --      Excl. in GC? --    No data found.  Updated Vital Signs BP 136/87 (BP Location: Left Arm)   Pulse 68   Temp 98.3 F (36.8 C) (Oral)   Resp 18   LMP 12/10/2019 (Exact Date)   SpO2 100%   Visual Acuity Right Eye Distance:   Left Eye Distance:   Bilateral Distance:    Right Eye Near:   Left Eye Near:    Bilateral Near:     Physical Exam Vitals and nursing note reviewed.  Constitutional:      General: She is not in acute distress.    Appearance: Normal appearance. She is not ill-appearing, toxic-appearing or diaphoretic.  HENT:     Head: Normocephalic.     Nose: Nose normal.  Eyes:     Conjunctiva/sclera: Conjunctivae normal.  Cardiovascular:     Rate and Rhythm: Normal rate and regular rhythm.  Pulmonary:     Effort: Pulmonary effort is normal.     Breath sounds: Normal breath  sounds.  Musculoskeletal:        General: Normal range of motion.     Cervical back: Normal range of motion.  Skin:    General: Skin is warm and dry.     Findings: No rash.  Neurological:     General: No focal deficit present.     Mental Status: She is alert.  Psychiatric:        Mood and Affect: Mood normal.      UC Treatments / Results  Labs (all labs ordered are listed, but only abnormal results are displayed) Labs Reviewed - No data to display  EKG   Radiology No results found.  Procedures Procedures (including critical care time)  Medications Ordered in UC Medications - No data to display  Initial Impression / Assessment and Plan / UC Course  I have reviewed the triage vital signs and the nursing notes.  Pertinent labs & imaging results that were available during my care of the patient were reviewed by me and considered in my medical decision making (see chart for details).     Fatigue, dizziness Patient neurological exam completely normal today.  Vital signs are normal and she is nontoxic or ill-appearing. Most likely symptoms are coming from low blood sugars and fluctuating blood sugars. She is on a very low dose of Metformin still with losing weight, exercise and healthy diet Recommend she follow-up with her primary care doctor.  She may need to discontinue the Metformin altogether. Recommended eating small meals to keep blood sugars regulated and checking blood sugars regularly. Follow up as needed for continued or worsening symptoms  Final Clinical Impressions(s) / UC Diagnoses   Final diagnoses:  Fatigue, unspecified type     Discharge Instructions     Believe that your symptoms related to low blood sugars or fluctuating blood sugars.   Nothing concerning on exam today. Recommend follow-up with your doctor for further management of your diabetes and possible discontinuation of the Metformin.  Make sure you are eating small meals to keep your blood  sugars up Check your blood sugars regularly. Keep eating a healthy diet and exercising as you have been Follow up as needed for continued or worsening symptoms     ED Prescriptions  None     PDMP not reviewed this encounter.   Janace Aris, NP 12/16/19 1148

## 2019-12-29 ENCOUNTER — Encounter: Payer: BC Managed Care – PPO | Admitting: Cardiology

## 2019-12-29 DIAGNOSIS — R002 Palpitations: Secondary | ICD-10-CM

## 2019-12-29 DIAGNOSIS — R011 Cardiac murmur, unspecified: Secondary | ICD-10-CM

## 2019-12-29 NOTE — Assessment & Plan Note (Signed)
Her murmur heard on exam sounding consistent with possible aortic sclerosis, however echocardiogram did not show any significant evidence of aortic valve pathology.  This would argue that the murmur is likely a flow murmur.

## 2019-12-29 NOTE — Progress Notes (Signed)
This encounter was created in error - please disregard.

## 2020-01-09 ENCOUNTER — Encounter (HOSPITAL_COMMUNITY): Payer: Self-pay

## 2020-01-09 ENCOUNTER — Ambulatory Visit (HOSPITAL_COMMUNITY)
Admission: EM | Admit: 2020-01-09 | Discharge: 2020-01-09 | Disposition: A | Discharge status: Home / Self Care | Payer: BC Managed Care – PPO | Attending: Emergency Medicine | Admitting: Emergency Medicine

## 2020-01-09 ENCOUNTER — Other Ambulatory Visit: Payer: Self-pay

## 2020-01-09 DIAGNOSIS — E119 Type 2 diabetes mellitus without complications: Secondary | ICD-10-CM

## 2020-01-09 DIAGNOSIS — R42 Dizziness and giddiness: Secondary | ICD-10-CM

## 2020-01-09 LAB — CBG MONITORING, ED: Glucose-Capillary: 96 mg/dL (ref 70–99)

## 2020-01-09 NOTE — ED Triage Notes (Signed)
Pt present a elevated blood sugar and dizziness from a strong odor from her car that made her feel sick to her stomach

## 2020-01-09 NOTE — Discharge Instructions (Addendum)
Follow-up with PCP if need. BP and blood sugar looked okay today

## 2020-01-09 NOTE — ED Provider Notes (Signed)
[facility]    CSN: [id number] Arrival date & time: 01/09/20  1757      History   Chief Complaint Chief Complaint  Patient presents with   Dizziness   Diabetes    HPI Madeline Maynard is a 49 y.o. female. presenting with dizzy, tachycardic, weak and diaphoretic. This episode occurred when a vehicle with a strong odor drove by. Denies headache, chest pain or shortness of breath. She has waiting for quite some time in the waiting room and all her symptoms have since resolved   HPI  Past Medical History:  Diagnosis Date   Controlled diabetes mellitus type II without complication (HCC)    Missed abortion    Palpitations    Wears glasses     Patient Active Problem List   Diagnosis Date Noted   Rapid palpitations 06/25/2019   Heart murmur 06/25/2019    Past Surgical History:  Procedure Laterality Date   DILATION AND CURETTAGE OF UTERUS  2010   DILATION AND EVACUATION N/A 07/15/2013   Procedure: DILATATION AND EVACUATION;  Surgeon: [name], MD;  Location: [facility];  Service: Gynecology;  Laterality: N/A;    OB History   No obstetric history on file.      Home Medications    Prior to Admission medications   Medication Sig Start Date End Date Taking? Authorizing Provider  Ascorbic Acid (VITAMIN C) 100 MG tablet Take 100 mg by mouth daily.    [provider]  metFORMIN (GLUCOPHAGE) 500 MG tablet Take 1 tablet (500 mg total) by mouth daily before breakfast. 04/04/19   Lamptey, Britta Mccreedy, MD  VITAMIN D PO Take by mouth.    [provider]    Family History Family History  Problem Relation Age of Onset   Diabetes Sister    Sudden death Mother 76       While living in [place], was walking home, stated that she did not feel well and collapsed dead   Diabetes Father     Social History Social History   Tobacco Use   Smoking status: Never Smoker   Smokeless tobacco: Never Used  Haematologist Use: Never used  Substance Use Topics   Alcohol use: No   Drug use: No     Allergies   Sulfa antibiotics   Review of Systems Review of Systems  Constitutional: Positive for diaphoresis and fatigue. Negative for chills.  Cardiovascular: Positive for chest pain and palpitations.  Gastrointestinal: Positive for nausea. Negative for vomiting.  Neurological: Positive for dizziness.     Physical Exam Triage Vital Signs ED Triage Vitals  Enc Vitals Group     BP 01/09/20 1825 (!) 150/86     Pulse Rate 01/09/20 1825 69     Resp 01/09/20 1825 16     Temp 01/09/20 1825 98.3 F (36.8 C)     Temp Source 01/09/20 1825 Oral     SpO2 01/09/20 1825 100 %     Weight --      Height --      Head Circumference --      Peak Flow --      Pain Score 01/09/20 1826 0     Pain Loc --      Pain Edu? --      Excl. in GC? --    No data found.  Updated Vital Signs BP (!) 150/86 (BP Location: Right Arm)    Pulse 69  Temp 98.3 F (36.8 C) (Oral)    Resp 16    LMP 12/10/2019 (Exact Date)    SpO2 100%   Visual Acuity Right Eye Distance:   Left Eye Distance:   Bilateral Distance:    Right Eye Near:   Left Eye Near:    Bilateral Near:     Physical Exam Vitals reviewed.  Constitutional:      Appearance: Normal appearance. She is normal weight.  HENT:     Head: Normocephalic and atraumatic.     Right Ear: Tympanic membrane and external ear normal.     Left Ear: Tympanic membrane and external ear normal.     Nose: Nose normal.     Mouth/Throat:     Mouth: Mucous membranes are moist.  Eyes:     Conjunctiva/sclera: Conjunctivae normal.     Pupils: Pupils are equal, round, and reactive to light.  Cardiovascular:     Rate and Rhythm: Normal rate.     Pulses: Normal pulses.  Pulmonary:     Effort: Pulmonary effort is normal. No respiratory distress.     Breath sounds: Normal breath sounds.  Musculoskeletal:        General: Normal range of motion.     Cervical back: Normal  range of motion. No rigidity.  Skin:    General: Skin is warm.     Coloration: Skin is not jaundiced or pale.  Neurological:     General: No focal deficit present.     Mental Status: She is alert and oriented to person, place, and time. Mental status is at baseline.     Cranial Nerves: No cranial nerve deficit.  Psychiatric:        Mood and Affect: Mood normal.        Behavior: Behavior normal.        Thought Content: Thought content normal.      UC Treatments / Results  Labs (all labs ordered are listed, but only abnormal results are displayed) Labs Reviewed  CBG MONITORING, ED    EKG   Radiology No results found.  Procedures Procedures (including critical care time)  Medications Ordered in UC Medications - No data to display  Initial Impression / Assessment and Plan / UC Course  I have reviewed the triage vital signs and the nursing notes.  Pertinent labs & imaging results that were available during my care of the patient were reviewed by me and considered in my medical decision making (see chart for details).     Dizziness of uncertain cause her BP and blood sugar are normal today.  I recommended f/u with her PCP for further evaluation if symptoms return  Final Clinical Impressions(s) / UC Diagnoses   Final diagnoses:  Dizziness     Discharge Instructions     Follow-up with PCP if need. BP and blood sugar looked okay today      ED Prescriptions    None     PDMP not reviewed this encounter.   Chilton Si, PA-C 01/09/20 1940

## 2020-03-26 ENCOUNTER — Ambulatory Visit (HOSPITAL_COMMUNITY)
Admission: EM | Admit: 2020-03-26 | Discharge: 2020-03-26 | Disposition: A | Discharge status: Home / Self Care | Payer: BC Managed Care – PPO | Attending: Urgent Care | Admitting: Urgent Care

## 2020-03-26 ENCOUNTER — Other Ambulatory Visit: Payer: Self-pay

## 2020-03-26 DIAGNOSIS — Z862 Personal history of diseases of the blood and blood-forming organs and certain disorders involving the immune mechanism: Secondary | ICD-10-CM | POA: Diagnosis present

## 2020-03-26 DIAGNOSIS — E119 Type 2 diabetes mellitus without complications: Secondary | ICD-10-CM | POA: Insufficient documentation

## 2020-03-26 DIAGNOSIS — R42 Dizziness and giddiness: Secondary | ICD-10-CM | POA: Diagnosis present

## 2020-03-26 DIAGNOSIS — R0602 Shortness of breath: Secondary | ICD-10-CM

## 2020-03-26 LAB — CBC
HCT: 39.5 % (ref 36.0–46.0)
Hemoglobin: 12.7 g/dL (ref 12.0–15.0)
MCH: 27 pg (ref 26.0–34.0)
MCHC: 32.2 g/dL (ref 30.0–36.0)
MCV: 83.9 fL (ref 80.0–100.0)
Platelets: 243 10*3/uL (ref 150–400)
RBC: 4.71 MIL/uL (ref 3.87–5.11)
RDW: 14.6 % (ref 11.5–15.5)
WBC: 4.5 10*3/uL (ref 4.0–10.5)
nRBC: 0 % (ref 0.0–0.2)

## 2020-03-26 NOTE — ED Triage Notes (Signed)
Patient presents to Urgent Care with complaints of frequent episodes of dizziness, light headed, SOB since the past 3-4 months. Patient reports has had to call EMS the past several weeks for these symptoms. She has reported concerns to her PCP, most recent appt. Was last week. All labs were with in normal range per pt report..Had covid test last week result was negative.

## 2020-03-26 NOTE — ED Provider Notes (Signed)
[facility]   MRN: [id number] DOB: 1970/12/26  Subjective:   Madeline Maynard is a 49 y.o. female with pmh of DM, anemia presenting for 4 to 84-month history of persistent intermittent dizziness, shortness of breath. Patient states that the symptoms occur randomly, feels a sudden sense of doom, sensation that she could die. She has called EMS out of her house a few times, has gone to the emergency room. Each time has resulted in normal tests including CBC, basic metabolic panel, blood sugar check, EKG. She is also had a negative chest x-ray. Patient has an appointment with her PCP in 1 month. Has already discussed her symptoms with her PCP and has had negative work-up. Patient has well-controlled diabetes, takes Metformin 500 mg once daily. She also has mild anemia and takes an iron supplement for this. Denies history of clotting disorder, PE. Denies history of asthma. Denies smoking cigarettes, alcohol use. Patient hydrates very well.  No current facility-administered medications for this encounter.  Current Outpatient Medications:    Ascorbic Acid (VITAMIN C) 100 MG tablet, Take 100 mg by mouth daily., Disp: , Rfl:    metFORMIN (GLUCOPHAGE) 500 MG tablet, Take 1 tablet (500 mg total) by mouth daily before breakfast., Disp: , Rfl:    VITAMIN D PO, Take by mouth., Disp: , Rfl:    Allergies  Allergen Reactions   Sulfa Antibiotics Hives    Past Medical History:  Diagnosis Date   Controlled diabetes mellitus type II without complication (HCC)    Missed abortion    Palpitations    Wears glasses      Past Surgical History:  Procedure Laterality Date   DILATION AND CURETTAGE OF UTERUS  2010   DILATION AND EVACUATION N/A 07/15/2013   Procedure: DILATATION AND EVACUATION;  Surgeon: [name], MD;  Location: [facility];  Service: Gynecology;  Laterality: N/A;    Family History  Problem Relation Age of Onset   Diabetes Sister     Sudden death Mother 67       While living in [place], was walking home, stated that she did not feel well and collapsed dead   Diabetes Father     Social History   Tobacco Use   Smoking status: Never Smoker   Smokeless tobacco: Never Used  Vaping Use   Vaping Use: Never used  Substance Use Topics   Alcohol use: No   Drug use: No    ROS   Objective:   Vitals: BP (S) (!) 142/90 (BP Location: Right Arm) Comment: reports 140's is baseline   Pulse 64    Temp 98.4 F (36.9 C) (Oral)    Resp 16    LMP 03/13/2020    SpO2 99%   Physical Exam Constitutional:      General: She is not in acute distress.    Appearance: Normal appearance. She is well-developed. She is not ill-appearing, toxic-appearing or diaphoretic.  HENT:     Head: Normocephalic and atraumatic.     Right Ear: External ear normal.     Left Ear: External ear normal.     Nose: Nose normal.     Mouth/Throat:     Mouth: Mucous membranes are moist.  Eyes:     General: No scleral icterus.       Right eye: No discharge.        Left eye: No discharge.     Extraocular Movements: Extraocular movements intact.     Conjunctiva/sclera: Conjunctivae  normal.     Pupils: Pupils are equal, round, and reactive to light.  Cardiovascular:     Rate and Rhythm: Normal rate and regular rhythm.     Pulses: Normal pulses.     Heart sounds: Normal heart sounds. No murmur heard.  No friction rub. No gallop.   Pulmonary:     Effort: Pulmonary effort is normal. No respiratory distress.     Breath sounds: Normal breath sounds. No stridor. No wheezing, rhonchi or rales.  Skin:    General: Skin is warm and dry.     Findings: No rash.  Neurological:     Mental Status: She is alert and oriented to person, place, and time.     Cranial Nerves: No cranial nerve deficit.     Motor: No weakness.     Coordination: Coordination normal.     Gait: Gait normal.  Psychiatric:        Mood and Affect: Mood normal.        Behavior: Behavior  normal.        Thought Content: Thought content normal.        Judgment: Judgment normal.     ED ECG REPORT   Date: 03/26/2020  Rate: 58bpm  Rhythm: sinus bradycardia  QRS Axis: normal  Intervals: normal  ST/T Wave abnormalities: normal  Conduction Disutrbances:none  Narrative Interpretation: Sinus bradycardia 58 bpm, no acute changes.  Very comparable to previous EKGs.  Old EKG Reviewed: unchanged  I have personally reviewed the EKG tracing and agree with the computerized printout as noted.   Assessment and Plan :   PDMP not reviewed this encounter.  1. Dizziness   2. Shortness of breath   3. Well controlled diabetes mellitus (HCC)   4. History of anemia     49 year old female with past medical history of well-controlled diabetes, mild anemia presenting for persistent dizziness and shortness of breath. Work-up has been negative up to now. EKG in clinic is very reassuring, stable vital signs. Recommended rechecking a CBC. Offered chest x-ray but patient refused. Suspect that she might need to discuss anxiety with her PCP given persistently negative work-up. We'll follow-up with results from CBC. Counseled patient on potential for adverse effects with medications prescribed/recommended today, ER and return-to-clinic precautions discussed, patient verbalized understanding.    Wallis Bamberg, PA-C 03/26/20 1156

## 2020-04-17 ENCOUNTER — Encounter (HOSPITAL_COMMUNITY): Payer: Self-pay | Admitting: Emergency Medicine

## 2020-04-17 ENCOUNTER — Emergency Department (HOSPITAL_COMMUNITY): Payer: BC Managed Care – PPO

## 2020-04-17 ENCOUNTER — Emergency Department (HOSPITAL_COMMUNITY)
Admission: EM | Admit: 2020-04-17 | Discharge: 2020-04-17 | Disposition: A | Discharge status: Home / Self Care | Payer: BC Managed Care – PPO | Attending: Emergency Medicine | Admitting: Emergency Medicine

## 2020-04-17 DIAGNOSIS — Z7984 Long term (current) use of oral hypoglycemic drugs: Secondary | ICD-10-CM | POA: Diagnosis not present

## 2020-04-17 DIAGNOSIS — R002 Palpitations: Secondary | ICD-10-CM | POA: Insufficient documentation

## 2020-04-17 DIAGNOSIS — R079 Chest pain, unspecified: Secondary | ICD-10-CM | POA: Diagnosis not present

## 2020-04-17 DIAGNOSIS — E1165 Type 2 diabetes mellitus with hyperglycemia: Secondary | ICD-10-CM | POA: Insufficient documentation

## 2020-04-17 DIAGNOSIS — R110 Nausea: Secondary | ICD-10-CM | POA: Diagnosis not present

## 2020-04-17 DIAGNOSIS — R61 Generalized hyperhidrosis: Secondary | ICD-10-CM | POA: Diagnosis not present

## 2020-04-17 LAB — TROPONIN I (HIGH SENSITIVITY)
Troponin I (High Sensitivity): 2 ng/L (ref <18)
Troponin I (High Sensitivity): <2 ng/L (ref <18)

## 2020-04-17 LAB — BASIC METABOLIC PANEL
Anion gap: 9 (ref 5–15)
BUN: 14 mg/dL (ref 6–20)
CO2: 24 mmol/L (ref 22–32)
Calcium: 9 mg/dL (ref 8.9–10.3)
Chloride: 104 mmol/L (ref 98–111)
Creatinine, Ser: 0.87 mg/dL (ref 0.44–1.00)
GFR, Estimated: >60 mL/min (ref >60)
Glucose, Bld: 166 mg/dL — ABNORMAL HIGH (ref 70–99)
Potassium: 3.9 mmol/L (ref 3.5–5.1)
Sodium: 137 mmol/L (ref 135–145)

## 2020-04-17 LAB — CBC
HCT: 39.5 % (ref 36.0–46.0)
Hemoglobin: 12.5 g/dL (ref 12.0–15.0)
MCH: 26.9 pg (ref 26.0–34.0)
MCHC: 31.6 g/dL (ref 30.0–36.0)
MCV: 84.9 fL (ref 80.0–100.0)
Platelets: 224 10*3/uL (ref 150–400)
RBC: 4.65 MIL/uL (ref 3.87–5.11)
RDW: 14.6 % (ref 11.5–15.5)
WBC: 4.1 10*3/uL (ref 4.0–10.5)
nRBC: 0 % (ref 0.0–0.2)

## 2020-04-17 LAB — I-STAT BETA HCG BLOOD, ED (MC, WL, AP ONLY): I-stat hCG, quantitative: <5 m[IU]/mL (ref <5)

## 2020-04-17 NOTE — ED Triage Notes (Signed)
Pt reports while driving to work this morning, she began feeling like her heart was racing and having chest pain and gen weakness. A/ox4, resp e/u, nad.

## 2020-04-17 NOTE — Discharge Instructions (Addendum)
Try holding try holding your Metformin as we discussed since the symptoms seem to be occurring after taking that medication.  Follow-up with your doctor on Monday as planned.  Return as needed for worsening symptoms

## 2020-04-17 NOTE — ED Notes (Signed)
Pt d/c home per MD order. Discharge summary reviewed with pt, pt verbalizes understanding. No complaints at discharge, ambulatory off unit. No s/s of acute distress noted.  Discharged home with husband.

## 2020-04-17 NOTE — ED Provider Notes (Signed)
[facility] EMERGENCY DEPARTMENT Provider Note   CSN: [id number] Arrival date & time: 04/17/20  0300     History Chief Complaint  Patient presents with  . Palpitations  . Chest Pain    Madeline Maynard is a 49 y.o. female.  HPI  HPI: A 49 year old patient with a history of treated diabetes presents for evaluation of chest pain. Initial onset of pain was less than one hour ago. The patient's chest pain is not worse with exertion. The patient complains of nausea and reports some diaphoresis. The patient's chest pain is not middle- or left-sided, is not well-localized, is not described as heaviness/pressure/tightness, is not sharp and does not radiate to the arms/jaw/neck. The patient has no history of stroke, has no history of peripheral artery disease, has not smoked in the past 90 days, has no relevant family history of coronary artery disease (first degree relative at less than age 41), is not hypertensive, has no history of hypercholesterolemia and does not have an elevated BMI (>=30).  Patient states she took her medication this morning.  As she was driving to work she suddenly started to feel lightheaded and felt like her heart was racing.  She felt chilled and diaphoretic.  She had pain in her chest when this occurred.  Patient has noticed that she has been having symptoms of feeling very poorly after taking her Metformin.  Symptoms lasted for about 20 minutes.  They were present on arrival  And have resolved at this point. Past Medical History:  Diagnosis Date  . Controlled diabetes mellitus type II without complication (HCC)   . Missed abortion   . Palpitations   . Wears glasses     Patient Active Problem List   Diagnosis Date Noted  . Rapid palpitations 06/25/2019  . Heart murmur 06/25/2019    Past Surgical History:  Procedure Laterality Date  . DILATION AND CURETTAGE OF UTERUS  2010  . DILATION AND EVACUATION N/A 07/15/2013   Procedure: DILATATION  AND EVACUATION;  Surgeon: [name], MD;  Location: [facility];  Service: Gynecology;  Laterality: N/A;     OB History   No obstetric history on file.     Family History  Problem Relation Age of Onset  . Diabetes Sister   . Sudden death Mother 61       While living in [place], was walking home, stated that she did not feel well and collapsed dead  . Diabetes Father     Social History   Tobacco Use  . Smoking status: Never Smoker  . Smokeless tobacco: Never Used  Vaping Use  . Vaping Use: Never used  Substance Use Topics  . Alcohol use: No  . Drug use: No    Home Medications Prior to Admission medications   Medication Sig Start Date End Date Taking? Authorizing Provider  Ascorbic Acid (VITAMIN C) 100 MG tablet Take 100 mg by mouth daily.   Yes [provider]  metFORMIN (GLUCOPHAGE-XR) 500 MG 24 hr tablet Take 250 mg by mouth 2 (two) times daily. 02/13/20  Yes [provider]  VITAMIN D PO Take 1 tablet by mouth daily.    Yes [provider]    Allergies    Sulfa antibiotics  Review of Systems   Review of Systems  All other systems reviewed and are negative.   Physical Exam Updated Vital Signs BP 125/80   Pulse 63   Temp 97.9 F (36.6 C)   Resp  13   SpO2 100%   Physical Exam Vitals and nursing note reviewed.  Constitutional:      General: She is not in acute distress.    Appearance: She is well-developed.  HENT:     Head: Normocephalic and atraumatic.     Right Ear: External ear normal.     Left Ear: External ear normal.  Eyes:     General: No scleral icterus.       Right eye: No discharge.        Left eye: No discharge.     Conjunctiva/sclera: Conjunctivae normal.  Neck:     Trachea: No tracheal deviation.  Cardiovascular:     Rate and Rhythm: Normal rate and regular rhythm.  Pulmonary:     Effort: Pulmonary effort is normal. No respiratory distress.     Breath sounds: Normal breath sounds. No  stridor. No wheezing or rales.  Abdominal:     General: Bowel sounds are normal. There is no distension.     Palpations: Abdomen is soft.     Tenderness: There is no abdominal tenderness. There is no guarding or rebound.  Musculoskeletal:        General: No tenderness.     Cervical back: Neck supple.  Skin:    General: Skin is warm and dry.     Findings: No rash.  Neurological:     Mental Status: She is alert.     Cranial Nerves: No cranial nerve deficit (no facial droop, extraocular movements intact, no slurred speech).     Sensory: No sensory deficit.     Motor: No abnormal muscle tone or seizure activity.     Coordination: Coordination normal.     ED Results / Procedures / Treatments   Labs (all labs ordered are listed, but only abnormal results are displayed) Labs Reviewed  BASIC METABOLIC PANEL - Abnormal; Notable for the following components:      Result Value   Glucose, Bld 166 (*)    All other components within normal limits  CBC  I-STAT BETA HCG BLOOD, ED ([facility], [facility], [facility] ONLY)  TROPONIN I (HIGH SENSITIVITY)  TROPONIN I (HIGH SENSITIVITY)    EKG EKG Interpretation  Date/Time:  Monday April 17 2020 08:27:27 EST Ventricular Rate:  71 PR Interval:  152 QRS Duration: 78 QT Interval:  378 QTC Calculation: 410 R Axis:   81 Text Interpretation: Normal sinus rhythm Right atrial enlargement Nonspecific ST abnormality , new since last tracing Abnormal ECG Confirmed by Linwood Dibbles [phone]) on 04/17/2020 10:09:52 AM   Radiology DG Chest 2 View  Result Date: 04/17/2020 CLINICAL DATA:  Palpitations, chest pain EXAM: CHEST - 2 VIEW COMPARISON:  12/12/2019 FINDINGS: Stable mild cardiomegaly without CHF. No focal pneumonia, collapse or consolidation. Negative for edema, effusion or pneumothorax. Trachea midline. Tortuosity noted of the aorta as before. No acute osseous finding. IMPRESSION: Cardiomegaly without acute chest process. Thoracic aortic tortuosity. Electronically  Signed   By: Judie Petit.  Shick M.D.   On: 04/17/2020 08:58    Procedures Procedures (including critical care time)  Medications Ordered in ED Medications - No data to display  ED Course  I have reviewed the triage vital signs and the nursing notes.  Pertinent labs & imaging results that were available during my care of the patient were reviewed by me and considered in my medical decision making (see chart for details).  Clinical Course as of Apr 19 1355  Mon Apr 17, 2020  1211 Delta troponin normal   [  JK]    Clinical Course User Index [JK] Linwood Dibbles, MD   MDM Rules/Calculators/A&P HEAR Score: 4                        Pt presented with palpitations and chest pain.  Sx not typical for acs.  Moderate risk heart score.  Serial troponin normal.  Doubt ACS.  Doubt PE , no dyspnea, sx resolved.  EKG without dysrhythmia.  Pt has noticed that sx seem to be related to her metformin.  Blood sugar only mildly elevated.  Will have her hold the metformin, pt has a follow up appointment with PCP on Monday.  Can re evaluate whether or not to resume metformin or consider an alternative. Final Clinical Impression(s) / ED Diagnoses Final diagnoses:  Palpitations  Chest pain, unspecified type    Rx / DC Orders ED Discharge Orders    None       Linwood Dibbles, MD 04/18/20 1356

## 2020-05-19 ENCOUNTER — Other Ambulatory Visit: Payer: Self-pay

## 2020-05-19 ENCOUNTER — Emergency Department (HOSPITAL_COMMUNITY)
Admission: EM | Admit: 2020-05-19 | Discharge: 2020-05-19 | Disposition: A | Discharge status: Home / Self Care | Payer: BC Managed Care – PPO | Attending: Emergency Medicine | Admitting: Emergency Medicine

## 2020-05-19 ENCOUNTER — Encounter (HOSPITAL_COMMUNITY): Payer: Self-pay

## 2020-05-19 DIAGNOSIS — Z5321 Procedure and treatment not carried out due to patient leaving prior to being seen by health care provider: Secondary | ICD-10-CM | POA: Insufficient documentation

## 2020-05-19 DIAGNOSIS — Z7984 Long term (current) use of oral hypoglycemic drugs: Secondary | ICD-10-CM | POA: Insufficient documentation

## 2020-05-19 DIAGNOSIS — R531 Weakness: Secondary | ICD-10-CM | POA: Diagnosis present

## 2020-05-19 DIAGNOSIS — E119 Type 2 diabetes mellitus without complications: Secondary | ICD-10-CM | POA: Insufficient documentation

## 2020-05-19 NOTE — ED Triage Notes (Addendum)
Pt BIB EMS [facility] [employer]. Pt stated she was feeling hot, sweaty, clammy- checked CBG and it was 124. Pt states she is still feeling weak. Pt denies pain, denies recent illness. Pt is diabetic, pt takes metformin. Pt A&Ox4, verbal.   143/80 70 HR 18 Resp EMS CBG 170

## 2020-08-15 NOTE — Progress Notes (Signed)
Date:  08/15/2020  ID:  Madeline Maynard, DOB May 21, 1971, MRN [id number]  PCP: Vernie Shanks, MD Cardiologist: Rex Kras, DO, [facility] (established care 08/17/2020) Former Cardiology Providers: Glenetta Hew, MD   REASON FOR CONSULT: Tachycardia  REQUESTING PHYSICIAN:  Vernie Shanks, MD [address] [place],  [place] [address]  Chief Complaint  Patient presents with  . Tachycardia  . New Patient (Initial Visit)    HPI  Madeline Maynard is a 50 y.o. female who presents to the office with a chief complaint of " palpitations." Patient's past medical history and cardiovascular risk factors include: Diet-controlled diabetes mellitus type 2, history of iron deficiency anemia, vitamin D deficiency, obesity due to excess calories.  She is referred to the office at the request of Vernie Shanks, MD for evaluation of Tachycardia.  Patient is accompanied by her husband at today's office visit.  She is referred to the office for evaluation of palpitations.  Mostly occurs in the middle of the night.  At times it is associated with either hypoglycemia or hyperglycemia.  Patient states that she was recently started on diabetes medication.  These episodes have been occurring several times over the last 6 months.  Mostly during the nighttime.  The symptoms make her get up in the middle of the night and after drinking cold water the symptoms usually resolve.  She states that she recently stopped taking the diabetic medications approximately 3 weeks ago and the symptoms have improved.  She denies any significant consumption of caffeine, illicit drugs, alcohol, herbal supplements, energy drinks, weight loss supplements.  Patient has lost weight over the last 2 years.  She was 192 pounds and now is 158 and the changes due to increasing exercise and improving her diet.  FUNCTIONAL STATUS: Walking three times a week for 35mn and gym on Saturday for an hour.    ALLERGIES: Allergies  Allergen  Reactions  . Sulfa Antibiotics Hives    MEDICATION LIST PRIOR TO VISIT: Current Meds  Medication Sig  . Ascorbic Acid (VITAMIN C) 100 MG tablet Take 100 mg by mouth daily.  . ferrous sulfate 325 (65 FE) MG EC tablet Take 325 mg by mouth daily.  . Multiple Vitamins-Minerals (WOMENS MULTIVITAMIN PO) Take 100 mg by mouth daily.  .Marland KitchenVITAMIN D PO Take 1 tablet by mouth daily.      PAST MEDICAL HISTORY: Past Medical History:  Diagnosis Date  . Controlled diabetes mellitus type II without complication ([place]   . Missed abortion   . Palpitations   . Wears glasses     PAST SURGICAL HISTORY: Past Surgical History:  Procedure Laterality Date  . DILATION AND CURETTAGE OF UTERUS  2010  . DILATION AND EVACUATION N/A 07/15/2013   Procedure: DILATATION AND EVACUATION;  Surgeon: RMargarette Asal MD;  [facility]  Service: Gynecology;  Laterality: N/A;    FAMILY HISTORY: The patient family history includes Diabetes in her father and sister; Sudden death (age of onset: 766 in her mother. No family history of premature coronary disease or sudden cardiac death.  SOCIAL HISTORY:  The patient  reports that she has never smoked. She has never used smokeless tobacco. She reports that she does not drink alcohol and does not use drugs.  REVIEW OF SYSTEMS: Review of Systems  Constitutional: Negative for chills and fever.  HENT: Negative for hoarse voice and nosebleeds.   Eyes: Negative for discharge, double vision and pain.  Cardiovascular: Positive for dyspnea on exertion (  improving) and palpitations (improved. ). Negative for chest pain, claudication, leg swelling, near-syncope, orthopnea, paroxysmal nocturnal dyspnea and syncope.  Respiratory: Negative for hemoptysis and shortness of breath.   Musculoskeletal: Negative for muscle cramps and myalgias.  Gastrointestinal: Negative for abdominal pain, constipation, diarrhea, hematemesis, hematochezia, melena, nausea and  vomiting.  Neurological: Negative for dizziness and light-headedness.    PHYSICAL EXAM: Vitals with BMI 08/17/2020 05/19/2020 05/19/2020  Height '4\' 9"'  - -  Weight 158 lbs 10 oz - -  BMI 41.32 - -  Systolic 440 102 725  Diastolic 74 92 89  Pulse 86 73 74   CONSTITUTIONAL: Well-developed and well-nourished. No acute distress.  SKIN: Skin is warm and dry. No rash noted. No cyanosis. No pallor. No jaundice HEAD: Normocephalic and atraumatic.  EYES: No scleral icterus MOUTH/THROAT: Moist oral membranes.  NECK: No JVD present. No thyromegaly noted. No carotid bruits  LYMPHATIC: No visible cervical adenopathy.  CHEST Normal respiratory effort. No intercostal retractions  LUNGS: Clear to auscultation bilaterally.  No stridor. No wheezes. No rales.  CARDIOVASCULAR: Regular rate and rhythm, positive S1-S2, no murmurs rubs or gallops appreciated. ABDOMINAL: No apparent ascites.  EXTREMITIES: No peripheral edema  HEMATOLOGIC: No significant bruising NEUROLOGIC: Oriented to person, place, and time. Nonfocal. Normal muscle tone.  PSYCHIATRIC: Normal mood and affect. Normal behavior. Cooperative  CARDIAC DATABASE: EKG: 08/17/2020: Normal sinus rhythm, 70 bpm, normal axis, biatrial enlargement, without underlying ischemia or injury pattern.  Echocardiogram: [facility] 07/05/2019: LVEF 65-70%, mild MR, mild to moderate TR, mild PASP.   Stress Testing: No results found for this or any previous visit from the past 1095 days.  Heart Catheterization: None  LABORATORY DATA: CBC Latest Ref Rng & Units 04/17/2020 03/26/2020 12/11/2019  WBC 4.0 - 10.5 K/uL 4.1 4.5 5.1  Hemoglobin 12.0 - 15.0 g/dL 12.5 12.7 11.8(L)  Hematocrit 36.0 - 46.0 % 39.5 39.5 36.5  Platelets 150 - 400 K/uL 224 243 227    CMP Latest Ref Rng & Units 04/17/2020 12/11/2019 04/20/2019  Glucose 70 - 99 mg/dL 166(H) 145(H) 147(H)  BUN 6 - 20 mg/dL '14 17 12  ' Creatinine 0.44 - 1.00 mg/dL 0.87 0.90 0.97  Sodium 135 - 145 mmol/L 137  134(L) 138  Potassium 3.5 - 5.1 mmol/L 3.9 4.0 4.0  Chloride 98 - 111 mmol/L 104 105 106  CO2 22 - 32 mmol/L 24 21(L) 24  Calcium 8.9 - 10.3 mg/dL 9.0 9.0 9.4  Total Protein 6.0 - 8.3 g/dL - - -  Total Bilirubin 0.2 - 1.2 mg/dL - - -  Alkaline Phos 39 - 117 U/L - - -  AST 0 - 37 U/L - - -  ALT 0 - 35 U/L - - -    Lipid Panel     Component Value Date/Time   CHOL 169 11/07/2013 1549   TRIG 54 11/07/2013 1549   HDL 77 11/07/2013 1549   CHOLHDL 2.2 11/07/2013 1549   VLDL 11 11/07/2013 1549   LDLCALC 81 11/07/2013 1549    No components found for: NTPROBNP No results for input(s): PROBNP in the last 8760 hours. No results for input(s): TSH in the last 8760 hours.  BMP Recent Labs    12/11/19 1545 04/17/20 0836  NA 134* 137  K 4.0 3.9  CL 105 104  CO2 21* 24  GLUCOSE 145* 166*  BUN 17 14  CREATININE 0.90 0.87  CALCIUM 9.0 9.0  GFRNONAA >60 >60  GFRAA >60  --     HEMOGLOBIN A1C No  results found for: HGBA1C, MPG  External Labs: Collected: July 24, 2020 Creatinine 0.78 mg/dL. eGFR: 94 mL/min per 1.73 m AST 11, ALT 9, alkaline phosphatase 55 Hemoglobin 12.5 g/dL, hematocrit 38.6% Hemoglobin A1c: 6.1 TSH: 1.25,  Collected: April 24, 2020 provided by PCP. Total cholesterol 186, triglycerides 45, HDL 85, LDL 91, non-HDL 100  IMPRESSION:    ICD-10-CM   1. Tachycardia  R00.0 EKG 12-Lead  2. Diet-controlled type 2 diabetes mellitus (HCC)  E11.9 PCV CARDIAC STRESS TEST    SARS-COV-2 RNA,(COVID-19) QUAL NAAT    Beta HCG, Quant    CT CARDIAC SCORING ([facility] LOCATIONS ONLY)    CANCELED: CT CARDIAC SCORING (SELF PAY ONLY)     RECOMMENDATIONS: Thy Gullikson is a 50 y.o. female whose past medical history and cardiac risk factors include: Diet-controlled diabetes mellitus type 2, history of iron deficiency anemia, vitamin D deficiency, obesity due to excess calories.  Palpitations/tachycardia: EKG shows normal sinus rhythm without underlying injury  pattern. The symptoms were associated during the episodes while she was on antiglycemic agents.  After the cessation of diabetic medications she states that the symptoms have essentially resolved and has not resurfaced. We have discussed undergoing a Holter monitor however due to the limited amount of symptoms she would like to hold off on a monitor at this time. Monitor for now.  Diet-controlled diabetes mellitus type 2: For further risk stratify her the shared decision was to proceed with exercise treadmill stress test to evaluate for functional status, stress ECG changes, and exercise-induced dysrhythmias.   Given her diabetes recommended a calcium score for further risk stratification. Most recent echocardiogram results reviewed and noted above for further reference.   FINAL MEDICATION LIST END OF ENCOUNTER: No orders of the defined types were placed in this encounter.   Medications Discontinued During This Encounter  Medication Reason  . metFORMIN (GLUCOPHAGE-XR) 500 MG 24 hr tablet Error     Current Outpatient Medications:  .  Ascorbic Acid (VITAMIN C) 100 MG tablet, Take 100 mg by mouth daily., Disp: , Rfl:  .  ferrous sulfate 325 (65 FE) MG EC tablet, Take 325 mg by mouth daily., Disp: , Rfl:  .  Multiple Vitamins-Minerals (WOMENS MULTIVITAMIN PO), Take 100 mg by mouth daily., Disp: , Rfl:  .  VITAMIN D PO, Take 1 tablet by mouth daily. , Disp: , Rfl:   Orders Placed This Encounter  Procedures  . SARS-COV-2 RNA,(COVID-19) QUAL NAAT  . CT CARDIAC SCORING ([facility] LOCATIONS ONLY)  . Beta HCG, Quant  . PCV CARDIAC STRESS TEST  . EKG 12-Lead    There are no Patient Instructions on file for this visit.   --Continue cardiac medications as reconciled in final medication list. --Return in about 4 weeks (around 09/14/2020) for Follow up, Palpitations, Review test results. Or sooner if needed. --Continue follow-up with your primary care physician regarding the management of your other  chronic comorbid conditions.  Patient's questions and concerns were addressed to her satisfaction. She voices understanding of the instructions provided during this encounter.   This note was created using a voice recognition software as a result there may be grammatical errors inadvertently enclosed that do not reflect the nature of this encounter. Every attempt is made to correct such errors.  Rex Kras, [place], [facility]  Pager: [phone] Office: [phone]

## 2020-08-17 ENCOUNTER — Other Ambulatory Visit: Payer: Self-pay

## 2020-08-17 ENCOUNTER — Ambulatory Visit (INDEPENDENT_AMBULATORY_CARE_PROVIDER_SITE_OTHER): Payer: BC Managed Care – PPO | Admitting: Cardiology

## 2020-08-17 ENCOUNTER — Encounter: Payer: Self-pay | Admitting: Cardiology

## 2020-08-17 VITALS — BP 133/74 | HR 86 | Temp 98.0°F | Resp 16 | Ht <= 58 in | Wt 158.6 lb

## 2020-08-17 DIAGNOSIS — R000 Tachycardia, unspecified: Secondary | ICD-10-CM

## 2020-08-17 DIAGNOSIS — E119 Type 2 diabetes mellitus without complications: Secondary | ICD-10-CM

## 2020-08-21 ENCOUNTER — Other Ambulatory Visit: Payer: Self-pay | Admitting: Obstetrics and Gynecology

## 2020-08-21 DIAGNOSIS — R928 Other abnormal and inconclusive findings on diagnostic imaging of breast: Secondary | ICD-10-CM

## 2020-08-26 ENCOUNTER — Other Ambulatory Visit (HOSPITAL_COMMUNITY)
Admission: RE | Admit: 2020-08-26 | Discharge: 2020-08-26 | Disposition: A | Discharge status: Home / Self Care | Payer: BC Managed Care – PPO | Source: Ambulatory Visit | Attending: Cardiology | Admitting: Cardiology

## 2020-08-26 DIAGNOSIS — Z20822 Contact with and (suspected) exposure to covid-19: Secondary | ICD-10-CM | POA: Insufficient documentation

## 2020-08-26 DIAGNOSIS — Z01812 Encounter for preprocedural laboratory examination: Secondary | ICD-10-CM | POA: Insufficient documentation

## 2020-08-26 LAB — SARS CORONAVIRUS 2 (TAT 6-24 HRS): SARS Coronavirus 2: NEGATIVE

## 2020-08-28 ENCOUNTER — Ambulatory Visit: Payer: BC Managed Care – PPO

## 2020-08-28 ENCOUNTER — Other Ambulatory Visit: Payer: Self-pay

## 2020-08-28 DIAGNOSIS — E119 Type 2 diabetes mellitus without complications: Secondary | ICD-10-CM

## 2020-08-31 NOTE — Progress Notes (Signed)
Attempted to call pt, no answer. Left vm requesting call back.

## 2020-09-01 NOTE — Progress Notes (Signed)
2nd attempt : Called patient, NA, LMAM

## 2020-09-02 LAB — BETA HCG QUANT (REF LAB): hCG Quant: <1 m[IU]/mL

## 2020-09-06 NOTE — Progress Notes (Signed)
3rd attempt : Called patient, NA, LMAM

## 2020-09-11 ENCOUNTER — Ambulatory Visit
Admission: RE | Admit: 2020-09-11 | Discharge: 2020-09-11 | Disposition: A | Discharge status: Home / Self Care | Payer: BC Managed Care – PPO | Source: Ambulatory Visit | Attending: Obstetrics and Gynecology | Admitting: Obstetrics and Gynecology

## 2020-09-11 ENCOUNTER — Other Ambulatory Visit: Payer: Self-pay

## 2020-09-11 DIAGNOSIS — R928 Other abnormal and inconclusive findings on diagnostic imaging of breast: Secondary | ICD-10-CM

## 2020-09-26 NOTE — Progress Notes (Signed)
ID:  Madeline Maynard, DOB 1970/08/28, MRN [id number]  PCP: Vernie Shanks, MD Cardiologist: Rex Kras, DO, [facility] (established care 08/17/2020) Former Cardiology Providers: Glenetta Hew, MD   Date: 09/28/20 Last Office Visit: 08/17/2020  Chief Complaint  Patient presents with  . Palpitations  . Follow-up  . Results    HPI  Madeline Maynard is a 50 y.o. female who presents to the office with a chief complaint of " 1 month follow-up for palpitations." Patient's past medical history and cardiovascular risk factors include: Diet-controlled diabetes mellitus type 2, history of iron deficiency anemia, vitamin D deficiency, obesity due to excess calories.  She is referred to the office at the request of Vernie Shanks, MD for evaluation of Tachycardia.  She was referred to the office for evaluation of tachycardia/palpitations.  Prior to establishing care with myself patient would have episodes during which she would wake up in the middle of the night sweating blood sugars would be consistent with either hypo or hyperglycemia.  Since she has been off of antiglycemic agents she no longer has episodes of palpitations.   Recommended extended Holter monitor at the last office visit; however, the shared decision was to hold off as her symptoms were improving and most likely secondary to blood sugar levels.  Given her other chronic comorbid conditions the shared decision was to undergo exercise treadmill stress test.  Patient was noted to have good functional capacity for age, no hypertensive response to exercise, no anginal discomfort, stress ECG negative for ischemia, overall a low risk study.  FUNCTIONAL STATUS: Walking three times a week for 57mn and gym on Saturday for an hour.    ALLERGIES: Allergies  Allergen Reactions  . Sulfa Antibiotics Hives    MEDICATION LIST PRIOR TO VISIT: Current Meds  Medication Sig  . Ascorbic Acid (VITAMIN C) 100 MG tablet Take 100 mg by mouth  daily.  . ferrous sulfate 325 (65 FE) MG EC tablet Take 325 mg by mouth daily.  . Multiple Vitamins-Minerals (WOMENS MULTIVITAMIN PO) Take 100 mg by mouth daily.  .Marland KitchenVITAMIN D PO Take 1 tablet by mouth daily.      PAST MEDICAL HISTORY: Past Medical History:  Diagnosis Date  . [place]   . Missed abortion   . Palpitations   . Wears glasses     PAST SURGICAL HISTORY: Past Surgical History:  Procedure Laterality Date  . DILATION AND CURETTAGE OF UTERUS  2010  . DILATION AND EVACUATION N/A 07/15/2013   Procedure: DILATATION AND EVACUATION;  Surgeon: RMargarette Asal MD;  [facility]  Service: Gynecology;  Laterality: N/A;    FAMILY HISTORY: The patient family history includes Diabetes in her father and sister; Sudden death (age of onset: 738 in her mother. No family history of premature coronary disease or sudden cardiac death.  SOCIAL HISTORY:  The patient  reports that she has never smoked. She has never used smokeless tobacco. She reports that she does not drink alcohol and does not use drugs.  REVIEW OF SYSTEMS: Review of Systems  Constitutional: Negative for chills and fever.  HENT: Negative for hoarse voice and nosebleeds.   Eyes: Negative for discharge, double vision and pain.  Cardiovascular: Negative for chest pain, claudication, dyspnea on exertion, leg swelling, near-syncope, orthopnea, palpitations, paroxysmal nocturnal dyspnea and syncope.  Respiratory: Negative for hemoptysis and shortness of breath.   Musculoskeletal: Negative for muscle cramps and myalgias.  Gastrointestinal: Negative for abdominal  pain, constipation, diarrhea, hematemesis, hematochezia, melena, nausea and vomiting.  Neurological: Negative for dizziness and light-headedness.    PHYSICAL EXAM: Vitals with BMI 09/28/2020 08/17/2020 05/19/2020  Height '4\' 9"'  '4\' 9"'  -  Weight 162 lbs 158 lbs 10 oz -  BMI 23.34 35.68 -   Systolic 616 837 290  Diastolic 72 74 92  Pulse 76 86 73   CONSTITUTIONAL: Well-developed and well-nourished. No acute distress.  SKIN: Skin is warm and dry. No rash noted. No cyanosis. No pallor. No jaundice HEAD: Normocephalic and atraumatic.  EYES: No scleral icterus MOUTH/THROAT: Moist oral membranes.  NECK: No JVD present. No thyromegaly noted. No carotid bruits  LYMPHATIC: No visible cervical adenopathy.  CHEST Normal respiratory effort. No intercostal retractions  LUNGS: Clear to auscultation bilaterally.  No stridor. No wheezes. No rales.  CARDIOVASCULAR: Regular rate and rhythm, positive S1-S2, no murmurs rubs or gallops appreciated. ABDOMINAL: No apparent ascites.  EXTREMITIES: No peripheral edema  HEMATOLOGIC: No significant bruising NEUROLOGIC: Oriented to person, place, and time. Nonfocal. Normal muscle tone.  PSYCHIATRIC: Normal mood and affect. Normal behavior. Cooperative  CARDIAC DATABASE: EKG: 08/17/2020: Normal sinus rhythm, 70 bpm, normal axis, biatrial enlargement, without underlying ischemia or injury pattern.  Echocardiogram: [facility] 07/05/2019: LVEF 65-70%, mild MR, mild to moderate TR, mild PASP.   Stress Testing: 08/28/2020:  Exercise treadmill stress test performed using Bruce protocol.  Patient reached 8.8 METS, and 91% of age predicted maximum heart rate.  Exercise capacity was fair.  No chest pain reported.  Normal heart rate and hemodynamic response. Stress EKG revealed no ischemic changes. Low risk study.   Heart Catheterization: None  LABORATORY DATA: CBC Latest Ref Rng & Units 04/17/2020 03/26/2020 12/11/2019  WBC 4.0 - 10.5 K/uL 4.1 4.5 5.1  Hemoglobin 12.0 - 15.0 g/dL 12.5 12.7 11.8(L)  Hematocrit 36.0 - 46.0 % 39.5 39.5 36.5  Platelets 150 - 400 K/uL 224 243 227    CMP Latest Ref Rng & Units 04/17/2020 12/11/2019 04/20/2019  Glucose 70 - 99 mg/dL 166(H) 145(H) 147(H)  BUN 6 - 20 mg/dL '14 17 12  ' Creatinine 0.44 - 1.00 mg/dL 0.87 0.90 0.97   Sodium 135 - 145 mmol/L 137 134(L) 138  Potassium 3.5 - 5.1 mmol/L 3.9 4.0 4.0  Chloride 98 - 111 mmol/L 104 105 106  CO2 22 - 32 mmol/L 24 21(L) 24  Calcium 8.9 - 10.3 mg/dL 9.0 9.0 9.4  Total Protein 6.0 - 8.3 g/dL - - -  Total Bilirubin 0.2 - 1.2 mg/dL - - -  Alkaline Phos 39 - 117 U/L - - -  AST 0 - 37 U/L - - -  ALT 0 - 35 U/L - - -    Lipid Panel     Component Value Date/Time   CHOL 169 11/07/2013 1549   TRIG 54 11/07/2013 1549   HDL 77 11/07/2013 1549   CHOLHDL 2.2 11/07/2013 1549   VLDL 11 11/07/2013 1549   LDLCALC 81 11/07/2013 1549    No components found for: NTPROBNP No results for input(s): PROBNP in the last 8760 hours. No results for input(s): TSH in the last 8760 hours.  BMP Recent Labs    12/11/19 1545 04/17/20 0836  NA 134* 137  K 4.0 3.9  CL 105 104  CO2 21* 24  GLUCOSE 145* 166*  BUN 17 14  CREATININE 0.90 0.87  CALCIUM 9.0 9.0  GFRNONAA >60 >60  GFRAA >60  --     HEMOGLOBIN A1C No results found for: HGBA1C,  MPG  External Labs: Collected: July 24, 2020 Creatinine 0.78 mg/dL. eGFR: 94 mL/min per 1.73 m AST 11, ALT 9, alkaline phosphatase 55 Hemoglobin 12.5 g/dL, hematocrit 38.6% Hemoglobin A1c: 6.1 TSH: 1.25,  Collected: April 24, 2020 provided by PCP. Total cholesterol 186, triglycerides 45, HDL 85, LDL 91, non-HDL 100  IMPRESSION:    ICD-10-CM   1. Tachycardia  R00.0   2. Diet-controlled type 2 diabetes mellitus (HCC)  E11.9   3. Nonrheumatic mitral valve regurgitation  I34.0      RECOMMENDATIONS: Shanita Kanan is a 50 y.o. female whose past medical history and cardiac risk factors include: Diet-controlled diabetes mellitus type 2, history of iron deficiency anemia, vitamin D deficiency, obesity due to excess calories.  From a cardiovascular standpoint patient is stable since last office visit.  She has not had any reoccurrence of palpitations or tachycardia since last office encounter.  Her symptoms originally  were most likely secondary to her labile blood glucose levels.  Agree with holding off on performing a Holter monitor as she is now asymptomatic.  Given her chronic comorbid conditions she underwent exercise treadmill stress test which was overall a low risk study results discussed with her in great detail.  Educated on the importance of risk factor modifications.  Recommended to have a coronary artery calcium score at the last office visit for further stratification.  However, for reasons unknown this was not performed.  Still recommended to have it done as her schedule allows and will reach out to her with the results.  Patient had an echocardiogram in 2021 at which notes mild/moderate valvular heart disease.  Overall asymptomatic and no additional testing needed at this time but she would benefit from a repeat echo in the next 3-5 years for routine surveillance.  I will see the patient on a as needed basis or in 2 years to reevaluate her valvular heart disease.  Patient is agreeable with the plan of care and appreciative of the work-up and the follow-through.  FINAL MEDICATION LIST END OF ENCOUNTER: No orders of the defined types were placed in this encounter.   There are no discontinued medications.   Current Outpatient Medications:  .  Ascorbic Acid (VITAMIN C) 100 MG tablet, Take 100 mg by mouth daily., Disp: , Rfl:  .  ferrous sulfate 325 (65 FE) MG EC tablet, Take 325 mg by mouth daily., Disp: , Rfl:  .  Multiple Vitamins-Minerals (WOMENS MULTIVITAMIN PO), Take 100 mg by mouth daily., Disp: , Rfl:  .  VITAMIN D PO, Take 1 tablet by mouth daily. , Disp: , Rfl:   No orders of the defined types were placed in this encounter.   There are no Patient Instructions on file for this visit.   --Continue cardiac medications as reconciled in final medication list. --Return in about 2 years (around 09/29/2022) for Follow up mitral regurgitation. Or sooner if needed. --Continue follow-up with  your primary care physician regarding the management of your other chronic comorbid conditions.  Patient's questions and concerns were addressed to her satisfaction. She voices understanding of the instructions provided during this encounter.   This note was created using a voice recognition software as a result there may be grammatical errors inadvertently enclosed that do not reflect the nature of this encounter. Every attempt is made to correct such errors.  Rex Kras, [place], [facility]  Pager: [phone] Office: [phone]

## 2020-09-28 ENCOUNTER — Encounter: Payer: Self-pay | Admitting: Cardiology

## 2020-09-28 ENCOUNTER — Ambulatory Visit: Payer: BC Managed Care – PPO | Admitting: Cardiology

## 2020-09-28 ENCOUNTER — Other Ambulatory Visit: Payer: Self-pay

## 2020-09-28 VITALS — BP 123/72 | HR 76 | Temp 98.3°F | Resp 16 | Ht <= 58 in | Wt 162.0 lb

## 2020-09-28 DIAGNOSIS — I340 Nonrheumatic mitral (valve) insufficiency: Secondary | ICD-10-CM

## 2020-09-28 DIAGNOSIS — E119 Type 2 diabetes mellitus without complications: Secondary | ICD-10-CM

## 2020-09-28 DIAGNOSIS — R000 Tachycardia, unspecified: Secondary | ICD-10-CM

## 2020-10-31 IMAGING — DX DG CHEST 1V PORT
1 series · 1 of 1 positions shown · non-contrast
Comparison: 08/09/2017

CLINICAL DATA: Shortness of breath

EXAM:
PORTABLE CHEST 1 VIEW

[chest ap]
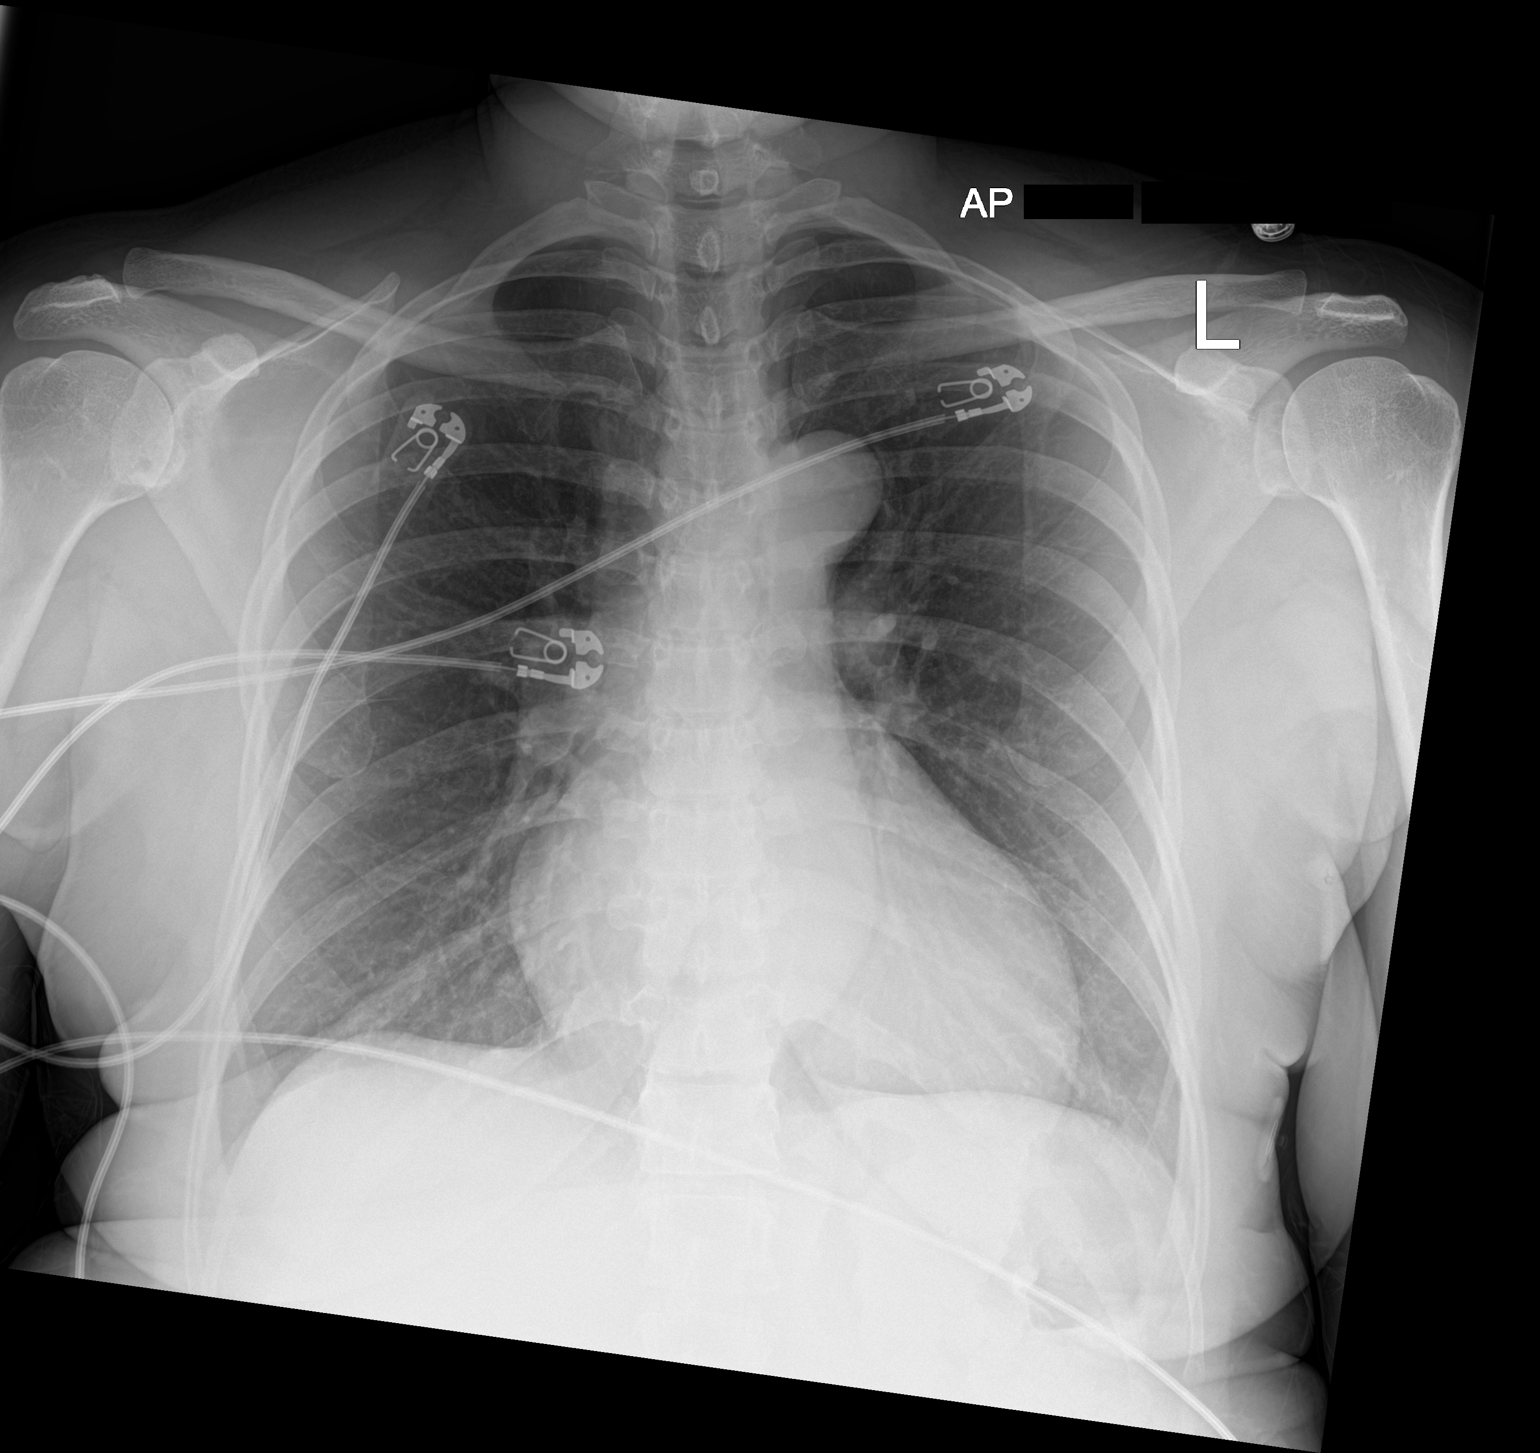

[1 of 1 positions shown; findings below may reference images not displayed]

FINDINGS: Cardiac shadow is stable. Tortuous aorta is again noted. The lungs
are well aerated bilaterally. No focal infiltrate, effusion or
pneumothorax is seen. No bony abnormality is noted.
IMPRESSION: No acute abnormality seen.

## 2021-01-24 ENCOUNTER — Encounter (HOSPITAL_COMMUNITY): Payer: Self-pay | Admitting: *Deleted

## 2021-01-24 ENCOUNTER — Ambulatory Visit (HOSPITAL_COMMUNITY)
Admission: EM | Admit: 2021-01-24 | Discharge: 2021-01-24 | Disposition: A | Discharge status: Home / Self Care | Payer: BC Managed Care – PPO | Attending: Emergency Medicine | Admitting: Emergency Medicine

## 2021-01-24 ENCOUNTER — Other Ambulatory Visit: Payer: Self-pay

## 2021-01-24 DIAGNOSIS — B349 Viral infection, unspecified: Secondary | ICD-10-CM | POA: Diagnosis present

## 2021-01-24 DIAGNOSIS — Z20822 Contact with and (suspected) exposure to covid-19: Secondary | ICD-10-CM | POA: Insufficient documentation

## 2021-01-24 NOTE — Discharge Instructions (Addendum)

## 2021-01-24 NOTE — ED Provider Notes (Signed)
[facility]    CSN: [id number] Arrival date & time: 01/24/21  1438      History   Chief Complaint Chief Complaint  Patient presents with   Nasal Congestion   Generalized Body Aches   Nausea    HPI Madeline Maynard is a 50 y.o. female.   Patient here for evaluation of congestion, generalized body aches, chills, and fatigue that have been ongoing since Monday.  Reports home COVID test was inconclusive.  Denies any recent sick contacts.  Has not taken any OTC medications or treatments.  Denies any trauma, injury, or other precipitating event.  Denies any specific alleviating or aggravating factors.  Denies any fevers, chest pain, shortness of breath, N/V/D, numbness, tingling, weakness, abdominal pain, or headaches.    The history is provided by the patient.   Past Medical History:  Diagnosis Date   Controlled diabetes mellitus type II without complication (HCC)    Missed abortion    Palpitations    Wears glasses     Patient Active Problem List   Diagnosis Date Noted   Rapid palpitations 06/25/2019   Heart murmur 06/25/2019    Past Surgical History:  Procedure Laterality Date   DILATION AND CURETTAGE OF UTERUS  2010   DILATION AND EVACUATION N/A 07/15/2013   Procedure: DILATATION AND EVACUATION;  Surgeon: [name], MD;  Location: [facility];  Service: Gynecology;  Laterality: N/A;    OB History   No obstetric history on file.      Home Medications    Prior to Admission medications   Medication Sig Start Date End Date Taking? Authorizing Provider  Ascorbic Acid (VITAMIN C) 100 MG tablet Take 100 mg by mouth daily.    [provider]  ferrous sulfate 325 (65 FE) MG EC tablet Take 325 mg by mouth daily.    [provider]  Multiple Vitamins-Minerals (WOMENS MULTIVITAMIN PO) Take 100 mg by mouth daily.    [provider]  VITAMIN D PO Take 1 tablet by mouth daily.     [provider]     Family History Family History  Problem Relation Age of Onset   Diabetes Sister    Sudden death Mother 49       While living in [place], was walking home, stated that she did not feel well and collapsed dead   Diabetes Father     Social History Social History   Tobacco Use   Smoking status: Never   Smokeless tobacco: Never  Vaping Use   Vaping Use: Never used  Substance Use Topics   Alcohol use: No   Drug use: No     Allergies   Sulfa antibiotics   Review of Systems Review of Systems  Constitutional:  Positive for fatigue.  HENT:  Positive for congestion and sore throat.   Musculoskeletal:  Positive for myalgias.  All other systems reviewed and are negative.   Physical Exam Triage Vital Signs ED Triage Vitals  Enc Vitals Group     BP 01/24/21 1555 (!) 164/97     Pulse Rate 01/24/21 1555 65     Resp 01/24/21 1555 20     Temp 01/24/21 1555 98.3 F (36.8 C)     Temp src --      SpO2 01/24/21 1555 100 %     Weight --      Height --      Head Circumference --      Peak Flow --  Pain Score 01/24/21 1552 2     Pain Loc --      Pain Edu? --      Excl. in GC? --    No data found.  Updated Vital Signs BP (!) 164/97   Pulse 65   Temp 98.3 F (36.8 C)   Resp 20   LMP 03/13/2020   SpO2 100%   Visual Acuity Right Eye Distance:   Left Eye Distance:   Bilateral Distance:    Right Eye Near:   Left Eye Near:    Bilateral Near:     Physical Exam Vitals and nursing note reviewed.  Constitutional:      General: She is not in acute distress.    Appearance: Normal appearance. She is not ill-appearing, toxic-appearing or diaphoretic.  HENT:     Head: Normocephalic and atraumatic.     Nose: Congestion present.     Mouth/Throat:     Pharynx: Posterior oropharyngeal erythema present. No oropharyngeal exudate.  Eyes:     Conjunctiva/sclera: Conjunctivae normal.  Cardiovascular:     Rate and Rhythm: Normal rate and regular rhythm.     Pulses: Normal  pulses.     Heart sounds: Normal heart sounds.  Pulmonary:     Effort: Pulmonary effort is normal.     Breath sounds: Normal breath sounds.  Abdominal:     General: Abdomen is flat.  Musculoskeletal:        General: Normal range of motion.     Cervical back: Normal range of motion.  Skin:    General: Skin is warm and dry.  Neurological:     General: No focal deficit present.     Mental Status: She is alert and oriented to person, place, and time.  Psychiatric:        Mood and Affect: Mood normal.     UC Treatments / Results  Labs (all labs ordered are listed, but only abnormal results are displayed) Labs Reviewed  SARS CORONAVIRUS 2 (TAT 6-24 HRS)    EKG   Radiology No results found.  Procedures Procedures (including critical care time)  Medications Ordered in UC Medications - No data to display  Initial Impression / Assessment and Plan / UC Course  I have reviewed the triage vital signs and the nursing notes.  Pertinent labs & imaging results that were available during my care of the patient were reviewed by me and considered in my medical decision making (see chart for details).    Assessment negative for red flags or concerns.  COVID test pending.  Likely viral illness.  Discussed current CDC recommendations for quarantine and work note given to patient.  Encourage fluids and rest.  Tylenol and/or ibuprofen as needed for pain and fevers.  Discussed conservative symptom management as described in discharge instructions.  Follow-up as needed. Final Clinical Impressions(s) / UC Diagnoses   Final diagnoses:  Viral illness     Discharge Instructions      We will contact you if your COVID test is positive.  Please quarantine while you wait for the results.  If your test is negative you may resume normal activities.  If your test is positive please continue to quarantine for at least 5 days from your symptom onset or until you are without a fever for at least 24  hours after the medications.  You can take Tylenol and/or Ibuprofen as needed for fever reduction and pain relief.   For cough: honey 1/2 to 1 teaspoon (you can  dilute the honey in water or another fluid).  You can also use guaifenesin and dextromethorphan for cough. You can use a humidifier for chest congestion and cough.  If you don't have a humidifier, you can sit in the bathroom with the hot shower running.     For sore throat: try warm salt water gargles, cepacol lozenges, throat spray, warm tea or water with lemon/honey, popsicles or ice, or OTC cold relief medicine for throat discomfort.    For congestion: take a daily anti-histamine like Zyrtec, Claritin, and a oral decongestant, such as pseudoephedrine.  You can also use Flonase 1-2 sprays in each nostril daily.    It is important to stay hydrated: drink plenty of fluids (water, gatorade/powerade/pedialyte, juices, or teas) to keep your throat moisturized and help further relieve irritation/discomfort.   Return or go to the Emergency Department if symptoms worsen or do not improve in the next few days.      ED Prescriptions   None    PDMP not reviewed this encounter.   Ivette Loyal, NP 01/24/21 [phone]

## 2021-01-24 NOTE — ED Triage Notes (Signed)
Pt reports COVID home test was inconclusive result. Pt has congestion and nausea.

## 2021-01-25 LAB — SARS CORONAVIRUS 2 (TAT 6-24 HRS): SARS Coronavirus 2: NEGATIVE

## 2021-03-01 ENCOUNTER — Encounter (HOSPITAL_COMMUNITY): Payer: Self-pay

## 2021-03-01 ENCOUNTER — Emergency Department (HOSPITAL_COMMUNITY): Payer: BC Managed Care – PPO

## 2021-03-01 ENCOUNTER — Other Ambulatory Visit: Payer: Self-pay

## 2021-03-01 ENCOUNTER — Emergency Department (HOSPITAL_COMMUNITY)
Admission: EM | Admit: 2021-03-01 | Discharge: 2021-03-02 | Disposition: A | Discharge status: Home / Self Care | Payer: BC Managed Care – PPO | Attending: Emergency Medicine | Admitting: Emergency Medicine

## 2021-03-01 DIAGNOSIS — E119 Type 2 diabetes mellitus without complications: Secondary | ICD-10-CM | POA: Diagnosis not present

## 2021-03-01 DIAGNOSIS — R112 Nausea with vomiting, unspecified: Secondary | ICD-10-CM | POA: Insufficient documentation

## 2021-03-01 DIAGNOSIS — R42 Dizziness and giddiness: Secondary | ICD-10-CM | POA: Diagnosis not present

## 2021-03-01 LAB — URINALYSIS, ROUTINE W REFLEX MICROSCOPIC
Bilirubin Urine: NEGATIVE
Glucose, UA: 50 mg/dL — AB
Hgb urine dipstick: NEGATIVE
Ketones, ur: 20 mg/dL — AB
Leukocytes,Ua: NEGATIVE
Nitrite: NEGATIVE
Protein, ur: NEGATIVE mg/dL
Specific Gravity, Urine: 1.023 (ref 1.005–1.030)
pH: 6 (ref 5.0–8.0)

## 2021-03-01 LAB — I-STAT CHEM 8, ED
BUN: 15 mg/dL (ref 6–20)
Calcium, Ion: 1.13 mmol/L — ABNORMAL LOW (ref 1.15–1.40)
Chloride: 107 mmol/L (ref 98–111)
Creatinine, Ser: 0.9 mg/dL (ref 0.44–1.00)
Glucose, Bld: 237 mg/dL — ABNORMAL HIGH (ref 70–99)
HCT: 37 % (ref 36.0–46.0)
Hemoglobin: 12.6 g/dL (ref 12.0–15.0)
Potassium: 3.3 mmol/L — ABNORMAL LOW (ref 3.5–5.1)
Sodium: 136 mmol/L (ref 135–145)
TCO2: 21 mmol/L — ABNORMAL LOW (ref 22–32)

## 2021-03-01 LAB — CBC WITH DIFFERENTIAL/PLATELET
Abs Immature Granulocytes: 0.04 10*3/uL (ref 0.00–0.07)
Basophils Absolute: 0 10*3/uL (ref 0.0–0.1)
Basophils Relative: 0 %
Eosinophils Absolute: 0 10*3/uL (ref 0.0–0.5)
Eosinophils Relative: 0 %
HCT: 37.1 % (ref 36.0–46.0)
Hemoglobin: 12.1 g/dL (ref 12.0–15.0)
Immature Granulocytes: 1 %
Lymphocytes Relative: 30 %
Lymphs Abs: 2.4 10*3/uL (ref 0.7–4.0)
MCH: 27 pg (ref 26.0–34.0)
MCHC: 32.6 g/dL (ref 30.0–36.0)
MCV: 82.8 fL (ref 80.0–100.0)
Monocytes Absolute: 0.5 10*3/uL (ref 0.1–1.0)
Monocytes Relative: 6 %
Neutro Abs: 5 10*3/uL (ref 1.7–7.7)
Neutrophils Relative %: 63 %
Platelets: 213 10*3/uL (ref 150–400)
RBC: 4.48 MIL/uL (ref 3.87–5.11)
RDW: 14.6 % (ref 11.5–15.5)
WBC: 8 10*3/uL (ref 4.0–10.5)
nRBC: 0 % (ref 0.0–0.2)

## 2021-03-01 LAB — COMPREHENSIVE METABOLIC PANEL
ALT: 13 U/L (ref 0–44)
AST: 22 U/L (ref 15–41)
Albumin: 4 g/dL (ref 3.5–5.0)
Alkaline Phosphatase: 64 U/L (ref 38–126)
Anion gap: 12 (ref 5–15)
BUN: 18 mg/dL (ref 6–20)
CO2: 19 mmol/L — ABNORMAL LOW (ref 22–32)
Calcium: 9.2 mg/dL (ref 8.9–10.3)
Chloride: 108 mmol/L (ref 98–111)
Creatinine, Ser: 0.85 mg/dL (ref 0.44–1.00)
GFR, Estimated: >60 mL/min (ref >60)
Glucose, Bld: 235 mg/dL — ABNORMAL HIGH (ref 70–99)
Potassium: 3.3 mmol/L — ABNORMAL LOW (ref 3.5–5.1)
Sodium: 139 mmol/L (ref 135–145)
Total Bilirubin: 0.7 mg/dL (ref 0.3–1.2)
Total Protein: 7.9 g/dL (ref 6.5–8.1)

## 2021-03-01 LAB — CBG MONITORING, ED: Glucose-Capillary: 197 mg/dL — ABNORMAL HIGH (ref 70–99)

## 2021-03-01 LAB — LIPASE, BLOOD: Lipase: 24 U/L (ref 11–51)

## 2021-03-01 MED ORDER — DIPHENHYDRAMINE HCL 50 MG/ML IJ SOLN
12.5000 mg | Freq: Once | INTRAMUSCULAR | Status: AC
  Administered 2021-03-01: 12.5 mg via INTRAVENOUS
  Filled 2021-03-01: qty 1

## 2021-03-01 MED ORDER — DIAZEPAM 5 MG/ML IJ SOLN
2.5000 mg | Freq: Once | INTRAMUSCULAR | Status: AC
  Administered 2021-03-01: 2.5 mg via INTRAVENOUS
  Filled 2021-03-01: qty 2

## 2021-03-01 MED ORDER — ONDANSETRON HCL 4 MG/2ML IJ SOLN
4.0000 mg | Freq: Once | INTRAMUSCULAR | Status: AC
  Administered 2021-03-01: 4 mg via INTRAVENOUS
  Filled 2021-03-01: qty 2

## 2021-03-01 MED ORDER — SODIUM CHLORIDE 0.9 % IV BOLUS
1000.0000 mL | Freq: Once | INTRAVENOUS | Status: AC
  Administered 2021-03-01: 1000 mL via INTRAVENOUS

## 2021-03-01 MED ORDER — IOHEXOL 350 MG/ML SOLN
80.0000 mL | Freq: Once | INTRAVENOUS | Status: AC | PRN
  Administered 2021-03-01: 80 mL via INTRAVENOUS

## 2021-03-01 MED ORDER — PROCHLORPERAZINE EDISYLATE 10 MG/2ML IJ SOLN
10.0000 mg | Freq: Once | INTRAMUSCULAR | Status: AC
  Administered 2021-03-01: 10 mg via INTRAVENOUS
  Filled 2021-03-01: qty 2

## 2021-03-01 MED ORDER — MECLIZINE HCL 25 MG PO TABS: 25.0000 mg | ORAL_TABLET | Freq: Once | ORAL | Status: DC

## 2021-03-01 NOTE — ED Provider Notes (Addendum)
[facility]-EMERGENCY DEPT Provider Note   CSN: [id number] Arrival date & time: 03/01/21  1930     History Chief Complaint  Patient presents with   Dizziness    Madeline Maynard is a 50 y.o. female.  The history is provided by the patient.  Dizziness Quality:  Head spinning Severity:  Severe Onset quality:  Sudden Duration:  2 hours Timing:  Constant Progression:  Unchanged Chronicity:  New Context: head movement   Relieved by:  Being still Worsened by:  Nothing Associated symptoms: nausea and vomiting   Associated symptoms: no blood in stool, no chest pain, no diarrhea, no headaches, no hearing loss, no palpitations, no shortness of breath, no syncope, no tinnitus, no vision changes and no weakness   Risk factors: no multiple medications and no new medications       Past Medical History:  Diagnosis Date   Controlled diabetes mellitus type II without complication (HCC)    Missed abortion    Palpitations    Wears glasses     Patient Active Problem List   Diagnosis Date Noted   Rapid palpitations 06/25/2019   Heart murmur 06/25/2019    Past Surgical History:  Procedure Laterality Date   DILATION AND CURETTAGE OF UTERUS  2010   DILATION AND EVACUATION N/A 07/15/2013   Procedure: DILATATION AND EVACUATION;  Surgeon: [name], MD;  Location: [facility];  Service: Gynecology;  Laterality: N/A;     OB History   No obstetric history on file.     Family History  Problem Relation Age of Onset   Diabetes Sister    Sudden death Mother 82       While living in [place], was walking home, stated that she did not feel well and collapsed dead   Diabetes Father     Social History   Tobacco Use   Smoking status: Never   Smokeless tobacco: Never  Vaping Use   Vaping Use: Never used  Substance Use Topics   Alcohol use: No   Drug use: No    Home Medications Prior to Admission medications   Medication Sig Start  Date End Date Taking? Authorizing Provider  Ascorbic Acid (VITAMIN C) 100 MG tablet Take 100 mg by mouth daily.    [provider]  ferrous sulfate 325 (65 FE) MG EC tablet Take 325 mg by mouth daily.    [provider]  Multiple Vitamins-Minerals (WOMENS MULTIVITAMIN PO) Take 100 mg by mouth daily.    [provider]  VITAMIN D PO Take 1 tablet by mouth daily.     [provider]    Allergies    Sulfa antibiotics  Review of Systems   Review of Systems  Constitutional:  Negative for chills and fever.  HENT:  Negative for ear pain, hearing loss, sore throat and tinnitus.   Eyes:  Negative for pain and visual disturbance.  Respiratory:  Negative for cough and shortness of breath.   Cardiovascular:  Negative for chest pain, palpitations and syncope.  Gastrointestinal:  Positive for nausea and vomiting. Negative for abdominal pain, blood in stool and diarrhea.  Genitourinary:  Negative for dysuria and hematuria.  Musculoskeletal:  Negative for arthralgias and back pain.  Skin:  Negative for color change and rash.  Neurological:  Positive for dizziness. Negative for tremors, seizures, syncope, facial asymmetry, speech difficulty, weakness, light-headedness, numbness and headaches.  All other systems reviewed and are negative.  Physical Exam Updated Vital Signs BP Marland Kitchen)  151/83   Pulse 71   Temp 98.1 F (36.7 C) (Oral)   Resp 18   LMP 03/13/2020   SpO2 100%   Physical Exam Vitals and nursing note reviewed.  Constitutional:      General: She is not in acute distress.    Appearance: She is well-developed. She is ill-appearing.  HENT:     Head: Normocephalic and atraumatic.     Nose: Nose normal.     Mouth/Throat:     Mouth: Mucous membranes are moist.  Eyes:     Extraocular Movements: Extraocular movements intact.     Conjunctiva/sclera: Conjunctivae normal.     Pupils: Pupils are equal, round, and reactive to light.  Cardiovascular:     Rate  and Rhythm: Normal rate and regular rhythm.     Pulses: Normal pulses.     Heart sounds: Normal heart sounds. No murmur heard. Pulmonary:     Effort: Pulmonary effort is normal. No respiratory distress.     Breath sounds: Normal breath sounds.  Abdominal:     General: Abdomen is flat.     Palpations: Abdomen is soft.     Tenderness: There is no abdominal tenderness.  Musculoskeletal:     Cervical back: Normal range of motion and neck supple.  Skin:    General: Skin is warm and dry.     Capillary Refill: Capillary refill takes less than 2 seconds.  Neurological:     General: No focal deficit present.     Mental Status: She is alert and oriented to person, place, and time.     Cranial Nerves: No cranial nerve deficit.     Sensory: No sensory deficit.     Motor: No weakness.     Coordination: Coordination normal.     Comments: 5+/5 strength throughout, normal sensation, no drift, normal finger to nose finger, normal speech, normal visual fields, horizontal nystagmus   Psychiatric:        Mood and Affect: Mood normal.    ED Results / Procedures / Treatments   Labs (all labs ordered are listed, but only abnormal results are displayed) Labs Reviewed  COMPREHENSIVE METABOLIC PANEL - Abnormal; Notable for the following components:      Result Value   Potassium 3.3 (*)    CO2 19 (*)    Glucose, Bld 235 (*)    All other components within normal limits  URINALYSIS, ROUTINE W REFLEX MICROSCOPIC - Abnormal; Notable for the following components:   Color, Urine STRAW (*)    Glucose, UA 50 (*)    Ketones, ur 20 (*)    All other components within normal limits  CBG MONITORING, ED - Abnormal; Notable for the following components:   Glucose-Capillary 197 (*)    All other components within normal limits  I-STAT CHEM 8, ED - Abnormal; Notable for the following components:   Potassium 3.3 (*)    Glucose, Bld 237 (*)    Calcium, Ion 1.13 (*)    TCO2 21 (*)    All other components within  normal limits  CBC WITH DIFFERENTIAL/PLATELET  LIPASE, BLOOD    EKG  Normal sinus rhytmn with normal intervals   Radiology CT Angio Head W or Wo Contrast  Result Date: 03/01/2021 CLINICAL DATA:  Dizziness EXAM: CT ANGIOGRAPHY HEAD AND NECK TECHNIQUE: Multidetector CT imaging of the head and neck was performed using the standard protocol during bolus administration of intravenous contrast. Multiplanar CT image reconstructions and MIPs were obtained to evaluate the vascular  anatomy. Carotid stenosis measurements (when applicable) are obtained utilizing NASCET criteria, using the distal internal carotid diameter as the denominator. CONTRAST:  59mL OMNIPAQUE IOHEXOL 350 MG/ML SOLN COMPARISON:  None. FINDINGS: CT HEAD FINDINGS Brain: There is no mass, hemorrhage or extra-axial collection. The size and configuration of the ventricles and extra-axial CSF spaces are normal. There is no acute or chronic infarction. The brain parenchyma is normal. Skull: The visualized skull base, calvarium and extracranial soft tissues are normal. Sinuses/Orbits: No fluid levels or advanced mucosal thickening of the visualized paranasal sinuses. No mastoid or middle ear effusion. The orbits are normal. CTA NECK FINDINGS SKELETON: There is no bony spinal canal stenosis. No lytic or blastic lesion. OTHER NECK: Normal pharynx, larynx and major salivary glands. No cervical lymphadenopathy. Unremarkable thyroid gland. UPPER CHEST: No pneumothorax or pleural effusion. No nodules or masses. AORTIC ARCH: There is no calcific atherosclerosis of the aortic arch. There is no aneurysm, dissection or hemodynamically significant stenosis of the visualized portion of the aorta. Conventional 3 vessel aortic branching pattern. The visualized proximal subclavian arteries are widely patent. RIGHT CAROTID SYSTEM: Normal without aneurysm, dissection or stenosis. LEFT CAROTID SYSTEM: Normal without aneurysm, dissection or stenosis. VERTEBRAL  ARTERIES: Left dominant configuration. Both origins are clearly patent. There is no dissection, occlusion or flow-limiting stenosis to the skull base (V1-V3 segments). CTA HEAD FINDINGS POSTERIOR CIRCULATION: --Vertebral arteries: Normal V4 segments. --Inferior cerebellar arteries: Normal. --Basilar artery: Normal. --Superior cerebellar arteries: Normal. --Posterior cerebral arteries (PCA): Normal. ANTERIOR CIRCULATION: --Intracranial internal carotid arteries: Normal. --Anterior cerebral arteries (ACA): Normal. Both A1 segments are present. Patent anterior communicating artery (a-comm). --Middle cerebral arteries (MCA): Normal. VENOUS SINUSES: As permitted by contrast timing, patent. ANATOMIC VARIANTS: None Review of the MIP images confirms the above findings. IMPRESSION: Normal CTA of the head and neck. Electronically Signed   By: Deatra Robinson M.D.   On: 03/01/2021 21:15   CT Angio Neck W and/or Wo Contrast  Result Date: 03/01/2021 CLINICAL DATA:  Dizziness EXAM: CT ANGIOGRAPHY HEAD AND NECK TECHNIQUE: Multidetector CT imaging of the head and neck was performed using the standard protocol during bolus administration of intravenous contrast. Multiplanar CT image reconstructions and MIPs were obtained to evaluate the vascular anatomy. Carotid stenosis measurements (when applicable) are obtained utilizing NASCET criteria, using the distal internal carotid diameter as the denominator. CONTRAST:  48mL OMNIPAQUE IOHEXOL 350 MG/ML SOLN COMPARISON:  None. FINDINGS: CT HEAD FINDINGS Brain: There is no mass, hemorrhage or extra-axial collection. The size and configuration of the ventricles and extra-axial CSF spaces are normal. There is no acute or chronic infarction. The brain parenchyma is normal. Skull: The visualized skull base, calvarium and extracranial soft tissues are normal. Sinuses/Orbits: No fluid levels or advanced mucosal thickening of the visualized paranasal sinuses. No mastoid or middle ear effusion.  The orbits are normal. CTA NECK FINDINGS SKELETON: There is no bony spinal canal stenosis. No lytic or blastic lesion. OTHER NECK: Normal pharynx, larynx and major salivary glands. No cervical lymphadenopathy. Unremarkable thyroid gland. UPPER CHEST: No pneumothorax or pleural effusion. No nodules or masses. AORTIC ARCH: There is no calcific atherosclerosis of the aortic arch. There is no aneurysm, dissection or hemodynamically significant stenosis of the visualized portion of the aorta. Conventional 3 vessel aortic branching pattern. The visualized proximal subclavian arteries are widely patent. RIGHT CAROTID SYSTEM: Normal without aneurysm, dissection or stenosis. LEFT CAROTID SYSTEM: Normal without aneurysm, dissection or stenosis. VERTEBRAL ARTERIES: Left dominant configuration. Both origins are clearly patent. There is no  dissection, occlusion or flow-limiting stenosis to the skull base (V1-V3 segments). CTA HEAD FINDINGS POSTERIOR CIRCULATION: --Vertebral arteries: Normal V4 segments. --Inferior cerebellar arteries: Normal. --Basilar artery: Normal. --Superior cerebellar arteries: Normal. --Posterior cerebral arteries (PCA): Normal. ANTERIOR CIRCULATION: --Intracranial internal carotid arteries: Normal. --Anterior cerebral arteries (ACA): Normal. Both A1 segments are present. Patent anterior communicating artery (a-comm). --Middle cerebral arteries (MCA): Normal. VENOUS SINUSES: As permitted by contrast timing, patent. ANATOMIC VARIANTS: None Review of the MIP images confirms the above findings. IMPRESSION: Normal CTA of the head and neck. Electronically Signed   By: Deatra Robinson M.D.   On: 03/01/2021 21:15    Procedures Procedures   Medications Ordered in ED Medications  sodium chloride 0.9 % bolus 1,000 mL (1,000 mLs Intravenous New Bag/Given 03/01/21 2014)  ondansetron (ZOFRAN) injection 4 mg (4 mg Intravenous Given 03/01/21 2002)  diazepam (VALIUM) injection 2.5 mg (2.5 mg Intravenous Given 03/01/21  2012)  iohexol (OMNIPAQUE) 350 MG/ML injection 80 mL (80 mLs Intravenous Contrast Given 03/01/21 2052)  prochlorperazine (COMPAZINE) injection 10 mg (10 mg Intravenous Given 03/01/21 2347)  diphenhydrAMINE (BENADRYL) injection 12.5 mg (12.5 mg Intravenous Given 03/01/21 2349)    ED Course  I have reviewed the triage vital signs and the nursing notes.  Pertinent labs & imaging results that were available during my care of the patient were reviewed by me and considered in my medical decision making (see chart for details).    MDM Rules/Calculators/A&P                           Madeline Maynard is a 50 year old female with history of diabetes who presents to the ED with dizziness.  Sudden onset about 2 hours prior to arrival.  Unremarkable vitals.  No fever.  States that she was feeling well when she got home from work and had sudden dizziness nausea and vomiting.  Difficult for her to keep her balance.  She has horizontal nystagmus on exam.  Actively vomiting.  Denies any headache.  No weakness or numbness or speech changes.  No vision changes.  She has normal finger-to-nose finger, normal speech, normal visual fields, normal heel-to-shin.  Strength and sensations intact.  This seems consistent with significant peripheral vertigo as she is very symptomatic however she is unable to ambulate and will likely rule out stroke with CTA head and neck and MRI.  We will get basic labs.  We will give IV Valium, IV fluids and reevaluate.  Lab work shows no significant anemia, electrolyte abnormality, kidney injury.  Blood sugar is mildly elevated but given IV fluids.  CTA of the head and neck is unremarkable.  Feeling slightly better but still symptomatic.  Will give Compazine and Benadryl.  MRI is pending at time of handoff to oncoming ED staff.  If MRI is normal will need reevaluation and still may need admission if still having difficulty with ambulation.  Please see Dr. Alric Ran note for further results,  evaluation, disposition of the patient.  This chart was dictated using voice recognition software.  Despite best efforts to proofread,  errors can occur which can change the documentation meaning.   Final Clinical Impression(s) / ED Diagnoses Final diagnoses:  Dizziness    Rx / DC Orders ED Discharge Orders     None        Virgina Norfolk, DO 03/01/21 2207    Virgina Norfolk, DO 03/02/21 0013

## 2021-03-01 NOTE — ED Notes (Signed)
MRI contacted they stated that they will get the patient to get MRI this evening

## 2021-03-01 NOTE — ED Notes (Signed)
Call placed for MRI  She will be next on the list

## 2021-03-01 NOTE — ED Triage Notes (Signed)
Pt BIB EMS from home. Pt c/o weakness, dizziness starting today. Pt has hx of diabetes, CBG 151 with EMS. EMS reports pt was diaphoretic upon arrival. Pt vomiting in triage.   HR 70 18G L AC 170/60 100% RA 18 Resp

## 2021-03-02 ENCOUNTER — Emergency Department (HOSPITAL_COMMUNITY): Payer: BC Managed Care – PPO

## 2021-03-02 MED ORDER — MECLIZINE HCL 25 MG PO TABS
50.0000 mg | ORAL_TABLET | Freq: Once | ORAL | Status: AC
  Administered 2021-03-02: 50 mg via ORAL
  Filled 2021-03-02: qty 2

## 2021-03-02 MED ORDER — MECLIZINE HCL 25 MG PO TABS: 25.0000 mg | ORAL_TABLET | Freq: Three times a day (TID) | ORAL | 0 refills | Status: AC | PRN

## 2021-03-02 MED ORDER — POTASSIUM CHLORIDE CRYS ER 20 MEQ PO TBCR
40.0000 meq | EXTENDED_RELEASE_TABLET | Freq: Once | ORAL | Status: AC
  Administered 2021-03-02: 40 meq via ORAL
  Filled 2021-03-02: qty 2

## 2021-03-02 NOTE — Discharge Instructions (Addendum)
It appears that you have vertigo.  Take the medicine prescribed as needed for vertigo.  If you are unable to walk or cannot safely walk or he develop a severe headache, vision changes, new or worsening dizziness, or any other new/concerning symptoms then return to the ER or call 911.

## 2021-03-02 NOTE — ED Notes (Signed)
Patient transported to MRI 

## 2021-03-02 NOTE — ED Notes (Signed)
Attempted to ambulate patient. Patient able to get up from bed and stand with assistance, but became weak and reports feeling dizzy. Pt unable to maintain balance and had to be returned to bed.

## 2021-03-02 NOTE — ED Provider Notes (Signed)
Care transferred to me.  Patient's MRI is negative.  She is still feeling pretty dizzy and can barely sit up.  I was able to give her Antivert which did seem to help a lot.  She is better to the point that she can stand up but then she gets dizzy when she tries to walk.  However she feels like she is good enough to go home and can rely on her husband to help get to the bathroom.  We did offer admission but she declines.  Will discharge with Antivert and return precautions.   Pricilla Loveless, MD 03/02/21 [phone]

## 2021-03-15 ENCOUNTER — Emergency Department (HOSPITAL_COMMUNITY)
Admission: EM | Admit: 2021-03-15 | Discharge: 2021-03-15 | Disposition: A | Discharge status: Home / Self Care | Payer: BC Managed Care – PPO | Attending: Emergency Medicine | Admitting: Emergency Medicine

## 2021-03-15 ENCOUNTER — Encounter (HOSPITAL_COMMUNITY): Payer: Self-pay | Admitting: Pharmacy Technician

## 2021-03-15 DIAGNOSIS — R059 Cough, unspecified: Secondary | ICD-10-CM | POA: Insufficient documentation

## 2021-03-15 DIAGNOSIS — R0981 Nasal congestion: Secondary | ICD-10-CM | POA: Insufficient documentation

## 2021-03-15 DIAGNOSIS — R519 Headache, unspecified: Secondary | ICD-10-CM | POA: Diagnosis not present

## 2021-03-15 DIAGNOSIS — R0989 Other specified symptoms and signs involving the circulatory and respiratory systems: Secondary | ICD-10-CM | POA: Insufficient documentation

## 2021-03-15 DIAGNOSIS — Z20822 Contact with and (suspected) exposure to covid-19: Secondary | ICD-10-CM | POA: Diagnosis not present

## 2021-03-15 DIAGNOSIS — E119 Type 2 diabetes mellitus without complications: Secondary | ICD-10-CM | POA: Diagnosis not present

## 2021-03-15 DIAGNOSIS — M545 Low back pain, unspecified: Secondary | ICD-10-CM | POA: Insufficient documentation

## 2021-03-15 LAB — RESP PANEL BY RT-PCR (FLU A&B, COVID) ARPGX2
Influenza A by PCR: NEGATIVE
Influenza B by PCR: NEGATIVE
SARS Coronavirus 2 by RT PCR: NEGATIVE

## 2021-03-15 MED ORDER — LIDOCAINE VISCOUS HCL 2 % MT SOLN: 15.0000 mL | OROMUCOSAL | 0 refills | Status: DC | PRN

## 2021-03-15 NOTE — ED Provider Notes (Signed)
[facility] EMERGENCY DEPARTMENT Provider Note   CSN: [id number] Arrival date & time: 03/15/21  0827     History No chief complaint on file.   Madeline Maynard is a 50 y.o. female.  The history is provided by the patient. No language interpreter was used.   50 year old female significant history of diabetes who presents with complaints of throat irritation.  Patient report last night she was eating dinner including a salad.  She believes she may have accidentally swallow a piece of salad that got stuck in the back of her throat.  She endorsed irritation to the left side of her neck, with associated coughing.  This morning she also complains of mild headaches, sinus congestion, and some achiness to her lower back.  She denies fever runny nose sneezing shortness of breath abdominal pain drooling.  She is up-to-date with COVID-vaccine.  No recent sick contact.  Irritation is mild to moderate in severity nonradiating, persistent.  Past Medical History:  Diagnosis Date   Controlled diabetes mellitus type II without complication (HCC)    Missed abortion    Palpitations    Wears glasses     Patient Active Problem List   Diagnosis Date Noted   Rapid palpitations 06/25/2019   Heart murmur 06/25/2019    Past Surgical History:  Procedure Laterality Date   DILATION AND CURETTAGE OF UTERUS  2010   DILATION AND EVACUATION N/A 07/15/2013   Procedure: DILATATION AND EVACUATION;  Surgeon: [name], MD;  Location: [facility];  Service: Gynecology;  Laterality: N/A;     OB History   No obstetric history on file.     Family History  Problem Relation Age of Onset   Diabetes Sister    Sudden death Mother 86       While living in [place], was walking home, stated that she did not feel well and collapsed dead   Diabetes Father     Social History   Tobacco Use   Smoking status: Never   Smokeless tobacco: Never  Vaping Use   Vaping Use:  Never used  Substance Use Topics   Alcohol use: No   Drug use: No    Home Medications Prior to Admission medications   Medication Sig Start Date End Date Taking? Authorizing Provider  Ascorbic Acid (VITAMIN C) 100 MG tablet Take 100 mg by mouth daily.    [provider]  ferrous sulfate 325 (65 FE) MG EC tablet Take 325 mg by mouth daily.    [provider]  meclizine (ANTIVERT) 25 MG tablet Take 1-2 tablets (25-50 mg total) by mouth 3 (three) times daily as needed for dizziness. 03/02/21   Pricilla Loveless, MD  Multiple Vitamins-Minerals (WOMENS MULTIVITAMIN PO) Take 1 tablet by mouth daily.    [provider]  VITAMIN D PO Take 1 tablet by mouth daily.     [provider]    Allergies    Sulfa antibiotics  Review of Systems   Review of Systems  All other systems reviewed and are negative.  Physical Exam Updated Vital Signs BP (!) 155/99 (BP Location: Left Arm)   Pulse 78   Temp 97.9 F (36.6 C)   Resp 19   LMP 03/13/2020   SpO2 100%   Physical Exam Vitals and nursing note reviewed.  Constitutional:      General: She is not in acute distress.    Appearance: She is well-developed.  HENT:     Head: Atraumatic.  Right Ear: Tympanic membrane normal.     Left Ear: Tympanic membrane normal.     Nose: Nose normal. No congestion.     Mouth/Throat:     Mouth: Mucous membranes are moist.     Pharynx: Oropharynx is clear. No oropharyngeal exudate or posterior oropharyngeal erythema.  Eyes:     Conjunctiva/sclera: Conjunctivae normal.  Cardiovascular:     Rate and Rhythm: Normal rate and regular rhythm.     Pulses: Normal pulses.     Heart sounds: Normal heart sounds.  Pulmonary:     Effort: Pulmonary effort is normal.     Breath sounds: No wheezing, rhonchi or rales.  Musculoskeletal:     Cervical back: Normal range of motion and neck supple. No tenderness.  Lymphadenopathy:     Cervical: No cervical adenopathy.  Skin:     Findings: No rash.  Neurological:     Mental Status: She is alert.  Psychiatric:        Mood and Affect: Mood normal.    ED Results / Procedures / Treatments   Labs (all labs ordered are listed, but only abnormal results are displayed) Labs Reviewed  RESP PANEL BY RT-PCR (FLU A&B, COVID) ARPGX2    EKG None  Radiology No results found.  Procedures Procedures   Medications Ordered in ED Medications - No data to display  ED Course  I have reviewed the triage vital signs and the nursing notes.  Pertinent labs & imaging results that were available during my care of the patient were reviewed by me and considered in my medical decision making (see chart for details).    MDM Rules/Calculators/A&P                           BP (!) 155/99 (BP Location: Left Arm)   Pulse 78   Temp 97.9 F (36.6 C)   Resp 19   LMP 03/13/2020   SpO2 100%   Final Clinical Impression(s) / ED Diagnoses Final diagnoses:  Globus sensation    Rx / DC Orders ED Discharge Orders          Ordered    lidocaine (XYLOCAINE) 2 % solution  As needed        03/15/21 0929           9:26 AM Patient here with complaints of throat and neck irritation after eating salad last night.  She has normal phonation in no respiratory discomfort lungs are clear throat exam unremarkable.  She is tolerates p.o. without difficulty.  Low suspicion for obstruction of the esophagus of the airway.  She also complains of some sinus congestion headache and low back pain.  Suspect this could be early signs of viral infection such as COVID or flu.  Respiratory panel ordered.  Patient otherwise stable for discharge.  Will prescribe viscous lidocaine for symptom control.  Return precaution given.   Fayrene Helper, PA-C 03/15/21 [id number]    Terald Sleeper, MD 03/15/21 [phone]

## 2021-03-15 NOTE — Discharge Instructions (Addendum)
Please take viscous lidocaine as needed for throat irritation.  Check MyChart for results of your flu and COVID test.  Return to the ER if you have any concern.

## 2021-03-15 NOTE — ED Triage Notes (Signed)
Pt here POV with reports of eating salad last night and coughing. Pt states she thinks a piece of salad is now stuck in her throat. Pt in NAD.

## 2021-07-12 ENCOUNTER — Other Ambulatory Visit: Payer: Self-pay | Admitting: Family Medicine

## 2021-07-12 DIAGNOSIS — R2689 Other abnormalities of gait and mobility: Secondary | ICD-10-CM

## 2021-07-12 DIAGNOSIS — G44059 Short lasting unilateral neuralgiform headache with conjunctival injection and tearing (SUNCT), not intractable: Secondary | ICD-10-CM

## 2021-07-26 ENCOUNTER — Ambulatory Visit
Admission: RE | Admit: 2021-07-26 | Discharge: 2021-07-26 | Disposition: A | Discharge status: Home / Self Care | Payer: BC Managed Care – PPO | Source: Ambulatory Visit | Attending: Family Medicine | Admitting: Family Medicine

## 2021-07-26 DIAGNOSIS — R2689 Other abnormalities of gait and mobility: Secondary | ICD-10-CM

## 2021-07-26 DIAGNOSIS — G44059 Short lasting unilateral neuralgiform headache with conjunctival injection and tearing (SUNCT), not intractable: Secondary | ICD-10-CM

## 2021-07-26 MED ORDER — IOPAMIDOL (ISOVUE-300) INJECTION 61%
75.0000 mL | Freq: Once | INTRAVENOUS | Status: AC | PRN
Start: 2021-07-26 — End: 2021-07-26
  Administered 2021-07-26: 75 mL via INTRAVENOUS

## 2021-08-25 ENCOUNTER — Encounter (HOSPITAL_COMMUNITY): Payer: Self-pay

## 2021-08-25 ENCOUNTER — Ambulatory Visit (HOSPITAL_COMMUNITY)
Admission: EM | Admit: 2021-08-25 | Discharge: 2021-08-25 | Disposition: A | Discharge status: Home / Self Care | Payer: BC Managed Care – PPO

## 2021-08-25 ENCOUNTER — Other Ambulatory Visit: Payer: Self-pay

## 2021-08-25 DIAGNOSIS — R519 Headache, unspecified: Secondary | ICD-10-CM | POA: Diagnosis not present

## 2021-08-25 MED ORDER — KETOROLAC TROMETHAMINE 30 MG/ML IJ SOLN
30.0000 mg | Freq: Once | INTRAMUSCULAR | Status: AC
Start: 2021-08-25 — End: 2021-08-25
  Administered 2021-08-25: 30 mg via INTRAMUSCULAR

## 2021-08-25 MED ORDER — KETOROLAC TROMETHAMINE 30 MG/ML IJ SOLN
INTRAMUSCULAR | Status: AC
  Filled 2021-08-25: qty 1

## 2021-08-25 NOTE — ED Triage Notes (Addendum)
3 day h/o intermittent frontal HA and elevated BP. Has been taking tylenol w/temporary relief. ?Confirms nausea and sound sensitivity. Denies emesis and light sensitivity. ?Pt notes hx of vertigo and is waiting to be seen by a neurologist. No CP or sob. ?

## 2021-08-25 NOTE — Discharge Instructions (Addendum)
-   we have given you an injection of Tordaol for the headache ?- Please make sure you are drinking plenty of fluids ?- if your symptoms worsen, please seek emergent care ?

## 2021-08-25 NOTE — ED Provider Notes (Signed)
?[facility] ? ? ? ?CSN: [id number] ?Arrival date & time: 08/25/21  1138 ? ? ?  ? ?History   ?Chief Complaint ?Chief Complaint  ?Patient presents with  ? Headache  ? ? ?HPI ?Madeline Maynard is a 51 y.o. female.  ? ?Patient reports headache present since Wednesday.  She reports some blurred vision, nausea without vomiting.  She also reports her blood pressure has been elevated since her headache started.  She is a little bit lightheaded, however has not fallen and does not feel like she is going to pass out.  She denies confusion, gait disturbance, behavioral changes.  She also denies fevers, chest pain, and shortness of breath.  She has taken Tylenol and Advil which do help with the headache temporarily, however the headache returns once the medication wears off. ? ?She was recently referred to Neurology for ongoing dizziness.  Does not take medication for hypertension; has diabetes and is reportedly compliant with Metformin. ? ? ?Past Medical History:  ?Diagnosis Date  ? Controlled diabetes mellitus type II without complication (HCC)   ? Missed abortion   ? Palpitations   ? Wears glasses   ? ? ?Patient Active Problem List  ? Diagnosis Date Noted  ? Rapid palpitations 06/25/2019  ? Heart murmur 06/25/2019  ? ? ?Past Surgical History:  ?Procedure Laterality Date  ? DILATION AND CURETTAGE OF UTERUS  2010  ? DILATION AND EVACUATION N/A 07/15/2013  ? Procedure: DILATATION AND EVACUATION;  Surgeon: [name], MD;  Location: [facility];  Service: Gynecology;  Laterality: N/A;  ? ? ?OB History   ?No obstetric history on file. ?  ? ? ? ?Home Medications   ? ?Prior to Admission medications   ?Medication Sig Start Date End Date Taking? Authorizing Provider  ?Ascorbic Acid (VITAMIN C) 100 MG tablet Take 100 mg by mouth daily.    [provider]  ?ferrous sulfate 325 (65 FE) MG EC tablet Take 325 mg by mouth daily.    [provider]  ?lidocaine (XYLOCAINE) 2 % solution Use  as directed 15 mLs in the mouth or throat as needed for mouth pain. 03/15/21   Fayrene Helper, PA-C  ?meclizine (ANTIVERT) 25 MG tablet Take 1-2 tablets (25-50 mg total) by mouth 3 (three) times daily as needed for dizziness. 03/02/21   Pricilla Loveless, MD  ?metFORMIN (GLUCOPHAGE-XR) 500 MG 24 hr tablet SMARTSIG:1 Tablet(s) By Mouth Every Evening 06/01/21   [provider]  ?Multiple Vitamins-Minerals (WOMENS MULTIVITAMIN PO) Take 1 tablet by mouth daily.    [provider]  ?VITAMIN D PO Take 1 tablet by mouth daily.     [provider]  ? ? ?Family History ?Family History  ?Problem Relation Age of Onset  ? Diabetes Sister   ? Sudden death Mother 29  ?     While living in [place], was walking home, stated that she did not feel well and collapsed dead  ? Diabetes Father   ? ? ?Social History ?Social History  ? ?Tobacco Use  ? Smoking status: Never  ? Smokeless tobacco: Never  ?Vaping Use  ? Vaping Use: Never used  ?Substance Use Topics  ? Alcohol use: No  ? Drug use: No  ? ? ? ?Allergies   ?Sulfa antibiotics ? ? ?Review of Systems ?Review of Systems ?Per HPI ? ?Physical Exam ?Triage Vital Signs ?ED Triage Vitals  ?Enc Vitals Group  ?   BP 08/25/21 1316 (!) 157/90  ?  Pulse Rate 08/25/21 1316 69  ?   Resp 08/25/21 1316 17  ?   Temp 08/25/21 1316 98 ?F (36.7 ?C)  ?   Temp Source 08/25/21 1316 Oral  ?   SpO2 08/25/21 1316 100 %  ?   Weight --   ?   Height --   ?   Head Circumference --   ?   Peak Flow --   ?   Pain Score 08/25/21 1314 9  ?   Pain Loc --   ?   Pain Edu? --   ?   Excl. in GC? --   ? ?No data found. ? ?Updated Vital Signs ?BP (!) 157/90 (BP Location: Left Arm)   Pulse 69   Temp 98 ?F (36.7 ?C) (Oral)   Resp 17   LMP 03/13/2020   SpO2 100%  ? ?Visual Acuity ?Right Eye Distance:   ?Left Eye Distance:   ?Bilateral Distance:   ? ?Right Eye Near:   ?Left Eye Near:    ?Bilateral Near:    ? ?Physical Exam ?Vitals and nursing note reviewed.  ?Constitutional:   ?   General: She is not  in acute distress. ?   Appearance: She is well-developed. She is not toxic-appearing.  ?HENT:  ?   Head: Normocephalic and atraumatic.  ?   Mouth/Throat:  ?   Mouth: Mucous membranes are moist.  ?   Pharynx: Oropharynx is clear.  ?Eyes:  ?   Extraocular Movements: Extraocular movements intact.  ?   Right eye: Nystagmus present. Normal extraocular motion.  ?   Left eye: Nystagmus present. Normal extraocular motion.  ?   Pupils: Pupils are equal, round, and reactive to light.  ?Cardiovascular:  ?   Rate and Rhythm: Normal rate and regular rhythm.  ?Pulmonary:  ?   Effort: Pulmonary effort is normal. No respiratory distress.  ?   Breath sounds: Normal breath sounds. No wheezing, rhonchi or rales.  ?Musculoskeletal:  ?   Cervical back: Normal range of motion and neck supple. No rigidity.  ?Skin: ?   General: Skin is warm and dry.  ?   Capillary Refill: Capillary refill takes less than 2 seconds.  ?   Coloration: Skin is not cyanotic.  ?   Findings: No rash.  ?Neurological:  ?   Mental Status: She is alert and oriented to person, place, and time.  ?   Cranial Nerves: No cranial nerve deficit.  ?   Sensory: No sensory deficit.  ?   Motor: No weakness.  ?   Gait: Gait normal.  ?Psychiatric:     ?   Mood and Affect: Mood normal.     ?   Behavior: Behavior normal.  ? ? ? ?UC Treatments / Results  ?Labs ?(all labs ordered are listed, but only abnormal results are displayed) ?Labs Reviewed - No data to display ? ?EKG ? ? ?Radiology ?No results found. ? ?Procedures ?Procedures (including critical care time) ? ?Medications Ordered in UC ?Medications  ?ketorolac (TORADOL) 30 MG/ML injection 30 mg (has no administration in time range)  ? ? ?Initial Impression / Assessment and Plan / UC Course  ?I have reviewed the triage vital signs and the nursing notes. ? ?Pertinent labs & imaging results that were available during my care of the patient were reviewed by me and considered in my medical decision making (see chart for  details). ? ?  ?Treat headache with Toradol 30 mg IM today.  No red flags today.  Encouraged plenty of hydration.  If symptoms persist or worsen, go to Emergency Room.  ?Final Clinical Impressions(s) / UC Diagnoses  ? ?Final diagnoses:  ?Acute intractable headache, unspecified headache type  ? ? ? ?Discharge Instructions   ? ?  ?- we have given you an injection of Tordaol for the headache ?- Please make sure you are drinking plenty of fluids ?- if your symptoms worsen, please seek emergent care ? ? ? ? ?ED Prescriptions   ?None ?  ? ?PDMP not reviewed this encounter. ?  ?Valentino NoseMartinez, Chaise Passarella A, NP ?08/25/21 1409 ? ?

## 2021-10-23 ENCOUNTER — Ambulatory Visit: Payer: BC Managed Care – PPO | Admitting: Neurology

## 2021-10-23 ENCOUNTER — Encounter: Payer: Self-pay | Admitting: Neurology

## 2021-10-23 VITALS — BP 132/85 | HR 67 | Ht <= 58 in | Wt 192.8 lb

## 2021-10-23 DIAGNOSIS — G43009 Migraine without aura, not intractable, without status migrainosus: Secondary | ICD-10-CM

## 2021-10-23 MED ORDER — ONDANSETRON 4 MG PO TBDP: 4.0000 mg | ORAL_TABLET | Freq: Three times a day (TID) | ORAL | 3 refills | Status: DC | PRN

## 2021-10-23 MED ORDER — NURTEC 75 MG PO TBDP: 75.0000 mg | ORAL_TABLET | Freq: Every day | ORAL | 11 refills | Status: AC | PRN

## 2021-10-23 MED ORDER — NURTEC 75 MG PO TBDP: 75.0000 mg | ORAL_TABLET | Freq: Every day | ORAL | 0 refills | Status: AC | PRN

## 2021-10-23 NOTE — Progress Notes (Signed)
[facility]    Provider:  Dr Lucia Gaskins Requesting Provider: Ileana Ladd, MD Primary Care Provider:  Ileana Ladd, MD  CC:  Migraines  HPI:  Madeline Maynard is a 50 y.o. female here as requested by Ileana Ladd, MD for daily headaches, obesity, vitamin D deficiency, iron deficiency anemia, hypoglycemia, migraines, imbalance, sciatic nerve pain, BMI 40-44, BPPV of right ear, type 2 diabetes, nocturnal leg cramps. PMHx diabetes type 2, palpitations, headaches, heart murmur.  I reviewed emergency room notes from August 25, 2021 with normal CT of the head and prior MRI of the brain and CTA of head and neck which were both normal as well (see below) I also reviewed Dr. Phylliss Bob notes, her primary care, consult for daily headaches, it appears she is on (in addition to her other medication) Imitrex and Fioricet and metoclopramide which can be used for headache management.  Dr. Nash Dimmer note states that she has daily headaches, benign paroxysmal positional vertigo over the right ear causing imbalance, the daily headaches are new, when he last saw her he ordered a CT of the brain which is normal per my review.   Migraines started in 2022 after a vertiginous episode in 2022 (see below) MRi was normal, CTA H&N  was normal, mother used to have very bad headaches, the headaches started in the setting of pre-menopausal symptoms, worse with menses but not strictly related to menses. Migraines start on the right side, to the back of the neck, lasts 1-2 days, tylenol doesn't help, no recent dizziness or vertigo with the migraines, pulsating/pounding/throbbing/heavy/pressure, light and sounds sensitivity, sound can be severe, movement makes it worse, nausea, her husband is here and provides information, she has about 4 migraine days a month that can be moderate to severe, a dark room helps, no aura, each migraine can last up to 2 days but no other headaches. No other focal neurologic deficits,  associated symptoms, inciting events or modifiable factors.  Reviewed notes, labs and imaging from outside physicians, which showed:  From a thorough review of records, medications tried that can be used in migraine management include Imitrex, metoclopramide, Fioricet, Flexeril, Decadron injection, Benadryl injection, ibuprofen tablets, Tylenol, ketorolac injections, meclizine tablets, Robaxin, Zofran injections, Compazine injections, Phenergan injections, Imitrex (side effects, sweaty, presyncope, severe side effects).   Labs performed by Dr. Edwin Dada office on review include hemoglobin A1c March 19, 2021 6.6, vitamin D 25 OH 30, BMP was unremarkable with creatinine 0.94 and BUN 17.  November 28, 2020 normal thyroid 1.25, CBC with differential unremarkable essentially normal, ferritin 109, iron panel was normal except for a low iron saturation of 16 (range 20 to 55%).  (Additional 15 minutes ot including visit reviewing images below)  CT head:   CT head 07/26/2021: showed No acute intracranial abnormalities including mass lesion or mass effect, hydrocephalus, extra-axial fluid collection, midline shift, hemorrhage, or acute infarction, large ischemic events (personally reviewed images)  03/01/2021: MRI brain w/wo and CTA H&N:   COMPARISON:  None.   FINDINGS: Brain: No acute infarct, mass effect or extra-axial collection. No acute or chronic hemorrhage. Normal white matter signal, parenchymal volume and CSF spaces. The midline structures are normal.   Vascular: Major flow voids are preserved.   Skull and upper cervical spine: Normal calvarium and skull base. Visualized upper cervical spine and soft tissues are normal.   Sinuses/Orbits:No paranasal sinus fluid levels or advanced mucosal thickening. No mastoid or middle ear effusion. Normal orbits.   IMPRESSION: Normal brain MRI.  FINDINGS: CT HEAD FINDINGS   Brain: There is no mass, hemorrhage or extra-axial collection. The size and  configuration of the ventricles and extra-axial CSF spaces are normal. There is no acute or chronic infarction. The brain parenchyma is normal.   Skull: The visualized skull base, calvarium and extracranial soft tissues are normal.   Sinuses/Orbits: No fluid levels or advanced mucosal thickening of the visualized paranasal sinuses. No mastoid or middle ear effusion. The orbits are normal.   CTA NECK FINDINGS   SKELETON: There is no bony spinal canal stenosis. No lytic or blastic lesion.   OTHER NECK: Normal pharynx, larynx and major salivary glands. No cervical lymphadenopathy. Unremarkable thyroid gland.   UPPER CHEST: No pneumothorax or pleural effusion. No nodules or masses.   AORTIC ARCH:   There is no calcific atherosclerosis of the aortic arch. There is no aneurysm, dissection or hemodynamically significant stenosis of the visualized portion of the aorta. Conventional 3 vessel aortic branching pattern. The visualized proximal subclavian arteries are widely patent.   RIGHT CAROTID SYSTEM: Normal without aneurysm, dissection or stenosis.   LEFT CAROTID SYSTEM: Normal without aneurysm, dissection or stenosis.   VERTEBRAL ARTERIES: Left dominant configuration. Both origins are clearly patent. There is no dissection, occlusion or flow-limiting stenosis to the skull base (V1-V3 segments).   CTA HEAD FINDINGS   POSTERIOR CIRCULATION:   --Vertebral arteries: Normal V4 segments.   --Inferior cerebellar arteries: Normal.   --Basilar artery: Normal.   --Superior cerebellar arteries: Normal.   --Posterior cerebral arteries (PCA): Normal.   ANTERIOR CIRCULATION:   --Intracranial internal carotid arteries: Normal.   --Anterior cerebral arteries (ACA): Normal. Both A1 segments are present. Patent anterior communicating artery (a-comm).   --Middle cerebral arteries (MCA): Normal.   VENOUS SINUSES: As permitted by contrast timing, patent.   ANATOMIC VARIANTS:  None   Review of the MIP images confirms the above findings.   IMPRESSION: Normal CTA of the head and neck.  I reviewed notes from emergency room, patient recently presented in August 25 2021 with a headache for several days, blurred vision, nausea without vomiting, also her blood pressure had been elevated since her headache started, lightheadedness, no syncope or presyncope, denied confusion, gait disturbance, behavioral changes, fevers, chest pain shortness of breath, Advil and Tylenol helped a little bit but then wore off, has diabetes and is reportedly compliant with metformin. Review of Systems: Patient complains of symptoms per HPI as well as the following symptoms migraines. Pertinent negatives and positives per HPI. All others negative.   Social History   Socioeconomic History   Marital status: Married    Spouse name: Not on file   Number of children: 3   Years of education: Not on file   Highest education level: Bachelor's degree (e.g., BA, AB, BS)  Occupational History   Occupation: Armed forces logistics/support/administrative officer: [employer]  Tobacco Use   Smoking status: Never   Smokeless tobacco: Never  Vaping Use   Vaping Use: Never used  Substance and Sexual Activity   Alcohol use: No   Drug use: No   Sexual activity: Yes    Birth control/protection: None  Other Topics Concern   Not on file  Social History Narrative   She has been married to her husband for 7 months.  She has 3 children who all live with her.   She walks 4 minutes a day..      She comes a very large family, originally from [place].  She  had 17 total siblings 8 from the same mother all from the same father.   Social Determinants of Health   Financial Resource Strain: Not on file  Food Insecurity: Not on file  Transportation Needs: Not on file  Physical Activity: Not on file  Stress: Not on file  Social Connections: Not on file  Intimate Partner Violence: Not on file    Family History  Problem  Relation Age of Onset   Diabetes Sister    Sudden death Mother 40       While living in [place], was walking home, stated that she did not feel well and collapsed dead   Diabetes Father     Past Medical History:  Diagnosis Date   Controlled diabetes mellitus type II without complication (HCC)    Missed abortion    Palpitations    Wears glasses     Patient Active Problem List   Diagnosis Date Noted   Migraine without aura and without status migrainosus, not intractable 10/23/2021   Rapid palpitations 06/25/2019   Heart murmur 06/25/2019    Past Surgical History:  Procedure Laterality Date   DILATION AND CURETTAGE OF UTERUS  2010   DILATION AND EVACUATION N/A 07/15/2013   Procedure: DILATATION AND EVACUATION;  Surgeon: [name], MD;  Location: [facility] [place];  Service: Gynecology;  Laterality: N/A;    Current Outpatient Medications  Medication Sig Dispense Refill   Ascorbic Acid (VITAMIN C) 100 MG tablet Take 100 mg by mouth daily.     ferrous sulfate 325 (65 FE) MG EC tablet Take 325 mg by mouth daily.     meclizine (ANTIVERT) 25 MG tablet Take 1-2 tablets (25-50 mg total) by mouth 3 (three) times daily as needed for dizziness. (Patient taking differently: Take 25-50 mg by mouth as needed for dizziness.) 20 tablet 0   metFORMIN (GLUCOPHAGE-XR) 500 MG 24 hr tablet SMARTSIG:1 Tablet(s) By Mouth Every Evening     Multiple Vitamins-Minerals (WOMENS MULTIVITAMIN PO) Take 1 tablet by mouth daily.     ondansetron (ZOFRAN-ODT) 4 MG disintegrating tablet Take 1-2 tablets (4-8 mg total) by mouth every 8 (eight) hours as needed. 30 tablet 3   Rimegepant Sulfate (NURTEC) 75 MG TBDP Take 75 mg by mouth daily as needed. For migraines. Take as close to onset of migraine as possible. One daily maximum. 16 tablet 11   Rimegepant Sulfate (NURTEC) 75 MG TBDP Take 75 mg by mouth daily as needed. For migraines. Take as close to onset of migraine as possible. One daily maximum. 2  tablet 0   VITAMIN D PO Take 1 tablet by mouth daily.      No current facility-administered medications for this visit.    Allergies as of 10/23/2021 - Review Complete 10/23/2021  Allergen Reaction Noted   Sulfa antibiotics Hives 07/14/2013    Vitals: BP 132/85   Pulse 67   Ht 4\' 9"  (1.448 m)   Wt 192 lb 12.8 oz (87.5 kg)   LMP 03/13/2020   BMI 41.72 kg/m  Last Weight:  Wt Readings from Last 1 Encounters:  10/23/21 192 lb 12.8 oz (87.5 kg)   Last Height:   Ht Readings from Last 1 Encounters:  10/23/21 4\' 9"  (1.448 m)     Physical exam: Exam: Gen: NAD, conversant, well nourised, obese, well groomed                     CV: RRR, no MRG. No Carotid Bruits. No peripheral  edema, warm, nontender Eyes: Conjunctivae clear without exudates or hemorrhage  Neuro: Detailed Neurologic Exam  Speech:    Speech is normal; fluent and spontaneous with normal comprehension.  Cognition:    The patient is oriented to person, place, and time;     recent and remote memory intact;     language fluent;     normal attention, concentration,     fund of knowledge Cranial Nerves:    The pupils are equal, round, and reactive to light. The fundi are normal and spontaneous venous pulsations are present. Visual fields are full to finger confrontation. Extraocular movements are intact. Trigeminal sensation is intact and the muscles of mastication are normal. The face is symmetric. The palate elevates in the midline. Hearing intact. Voice is normal. Shoulder shrug is normal. The tongue has normal motion without fasciculations.   Coordination:    Normal finger to nose and heel to shin. Normal rapid alternating movements.   Gait:    Heel-toe and tandem gait are normal.   Motor Observation:    No asymmetry, no atrophy, and no involuntary movements noted. Tone:    Normal muscle tone.    Posture:    Posture is normal. normal erect    Strength:    Strength is V/V in the upper and lower limbs.       Sensation: intact to LT     Reflex Exam:  DTR's:    Deep tendon reflexes in the upper and lower extremities are normal bilaterally.   Toes:    The toes are downgoing bilaterally.   Clonus:    Clonus is absent.    Assessment/Plan:  Migraines started in 2022 after a vertiginous episode in 2022. MRI was normal, CTA H&N  was normal, mother used to have very bad headaches, the headaches started in the setting of pre-menopausal symptoms, worse with menses but not strictly related to menses. Migraines start on the right side, to the back of the neck, lasts 1-2 days, tylenol doesn't help, no recent dizziness or vertigo with the migraines, pulsating/pounding/throbbing/heavy/pressure, light and sounds sensitivity, sound can be severe, movement makes it worse, nausea, her husband is here and provides information, she has about 4 migraine days a month that can be moderate to severe, a dark room helps, no aura,   Triptans contraindicated due to severe reaction with Imitrex: dizziness, pre-syncope, chest pain, sweating, will not prescribe another triptan. Try Nurtec.    No orders of the defined types were placed in this encounter.  Meds ordered this encounter  Medications   Rimegepant Sulfate (NURTEC) 75 MG TBDP    Sig: Take 75 mg by mouth daily as needed. For migraines. Take as close to onset of migraine as possible. One daily maximum.    Dispense:  16 tablet    Refill:  11   ondansetron (ZOFRAN-ODT) 4 MG disintegrating tablet    Sig: Take 1-2 tablets (4-8 mg total) by mouth every 8 (eight) hours as needed.    Dispense:  30 tablet    Refill:  3   Rimegepant Sulfate (NURTEC) 75 MG TBDP    Sig: Take 75 mg by mouth daily as needed. For migraines. Take as close to onset of migraine as possible. One daily maximum.    Dispense:  2 tablet    Refill:  0    [id number] 01-2024    Cc: Ileana LaddWong, Francis P, MD,  Ileana LaddWong, Francis P, MD  Naomie DeanAntonia Verlon Pischke, MD  [facility] [address]  [address] [place], [place] [address]  Phone [phone] Fax [phone]  I spent over 60 minutes of face-to-face and non-face-to-face time with patient on the  1. Migraine without aura and without status migrainosus, not intractable    diagnosis.  This included previsit chart review, lab review, study review, order entry, electronic health record documentation, patient education on the different diagnostic and therapeutic options, counseling and coordination of care, risks and benefits of management, compliance, or risk factor reduction

## 2021-10-23 NOTE — Patient Instructions (Signed)
Nurtec: take RIGHT at onset of migraine.May take with alleve/tylenol/ondansetron as well if needed.   Rimegepant Disintegrating Tablets What is this medication? RIMEGEPANT (ri ME je pant) prevents and treats migraines. It works by blocking a substance in the body that causes migraines. This medicine may be used for other purposes; ask your health care provider or pharmacist if you have questions. COMMON BRAND NAME(S): NURTEC ODT What should I tell my care team before I take this medication? They need to know if you have any of these conditions: Kidney disease Liver disease An unusual or allergic reaction to rimegepant, other medications, foods, dyes, or preservatives Pregnant or trying to get pregnant Breast-feeding How should I use this medication? Take this medication by mouth. Take it as directed on the prescription label. Leave the tablet in the sealed pack until you are ready to take it. With dry hands, open the pack and gently remove the tablet. If the tablet breaks or crumbles, throw it away. Use a new tablet. Place the tablet in the mouth and allow it to dissolve. Then, swallow it. Do not cut, crush, or chew this medication. You do not need water to take this medication. Talk to your care team about the use of this medication in children. Special care may be needed. Overdosage: If you think you have taken too much of this medicine contact a poison control center or emergency room at once. NOTE: This medicine is only for you. Do not share this medicine with others. What if I miss a dose? This does not apply. This medication is not for regular use. What may interact with this medication? Certain medications for fungal infections, such as fluconazole, itraconazole Rifampin This list may not describe all possible interactions. Give your health care provider a list of all the medicines, herbs, non-prescription drugs, or dietary supplements you use. Also tell them if you smoke, drink  alcohol, or use illegal drugs. Some items may interact with your medicine. What should I watch for while using this medication? Visit your care team for regular checks on your progress. Tell your care team if your symptoms do not start to get better or if they get worse. What side effects may I notice from receiving this medication? Side effects that you should report to your care team as soon as possible: Allergic reactions--skin rash, itching, hives, swelling of the face, lips, tongue, or throat Side effects that usually do not require medical attention (report to your care team if they continue or are bothersome): Nausea Stomach pain This list may not describe all possible side effects. Call your doctor for medical advice about side effects. You may report side effects to FDA at 1-800-FDA-1088. Where should I keep my medication? Keep out of the reach of children and pets. Store at room temperature between 20 and 25 degrees C (68 and 77 degrees F). Get rid of any unused medication after the expiration date. To get rid of medications that are no longer needed or have expired: Take the medication to a medication take-back program. Check with your pharmacy or law enforcement to find a location. If you cannot return the medication, check the label or package insert to see if the medication should be thrown out in the garbage or flushed down the toilet. If you are not sure, ask your care team. If it is safe to put it in the trash, take the medication out of the container. Mix the medication with cat litter, dirt, coffee grounds, or other unwanted  substance. Seal the mixture in a bag or container. Put it in the trash. NOTE: This sheet is a summary. It may not cover all possible information. If you have questions about this medicine, talk to your doctor, pharmacist, or health care provider.  2023 Elsevier/Gold Standard (2021-07-11 00:00:00)  Ondansetron Dissolving Tablets What is this  medication? ONDANSETRON (on DAN se tron) prevents nausea and vomiting from chemotherapy, radiation, or surgery. It works by blocking substances in the body that may cause nausea or vomiting. It belongs to a group of medications called antiemetics. This medicine may be used for other purposes; ask your health care provider or pharmacist if you have questions. COMMON BRAND NAME(S): Zofran ODT What should I tell my care team before I take this medication? They need to know if you have any of these conditions: Heart disease History of irregular heartbeat Liver disease Low levels of magnesium or potassium in the blood An unusual or allergic reaction to ondansetron, granisetron, other medications, foods, dyes, or preservatives Pregnant or trying to get pregnant Breast-feeding How should I use this medication? These tablets are made to dissolve in the mouth. Do not try to push the tablet through the foil backing. With dry hands, peel away the foil backing and gently remove the tablet. Place the tablet in the mouth and allow it to dissolve, then swallow. While you may take these tablets with water, it is not necessary to do so. Talk to your care team regarding the use of this medication in children. Special care may be needed. Overdosage: If you think you have taken too much of this medicine contact a poison control center or emergency room at once. NOTE: This medicine is only for you. Do not share this medicine with others. What if I miss a dose? If you miss a dose, take it as soon as you can. If it is almost time for your next dose, take only that dose. Do not take double or extra doses. What may interact with this medication? Do not take this medication with any of the following: Apomorphine Certain medications for fungal infections like fluconazole, itraconazole, ketoconazole, posaconazole, voriconazole Cisapride Dronedarone Pimozide Thioridazine This medication may also interact with the  following: Carbamazepine Certain medications for depression, anxiety, or psychotic disturbances Fentanyl Linezolid MAOIs like Carbex, Eldepryl, Marplan, Nardil, and Parnate Methylene blue (injected into a vein) Other medications that prolong the QT interval (cause an abnormal heart rhythm) like dofetilide, ziprasidone Phenytoin Rifampicin Tramadol This list may not describe all possible interactions. Give your health care provider a list of all the medicines, herbs, non-prescription drugs, or dietary supplements you use. Also tell them if you smoke, drink alcohol, or use illegal drugs. Some items may interact with your medicine. What should I watch for while using this medication? Check with your care team as soon as you can if you have any sign of an allergic reaction. What side effects may I notice from receiving this medication? Side effects that you should report to your care team as soon as possible: Allergic reactions--skin rash, itching, hives, swelling of the face, lips, tongue, or throat Bowel blockage--stomach cramping, unable to have a bowel movement or pass gas, loss of appetite, vomiting Chest pain (angina)--pain, pressure, or tightness in the chest, neck, back, or arms Heart rhythm changes--fast or irregular heartbeat, dizziness, feeling faint or lightheaded, chest pain, trouble breathing Irritability, confusion, fast or irregular heartbeat, muscle stiffness, twitching muscles, sweating, high fever, seizure, chills, vomiting, diarrhea, which may be signs of  serotonin syndrome Side effects that usually do not require medical attention (report to your care team if they continue or are bothersome): Constipation Diarrhea General discomfort and fatigue Headache This list may not describe all possible side effects. Call your doctor for medical advice about side effects. You may report side effects to FDA at 1-800-FDA-1088. Where should I keep my medication? Keep out of the reach  of children and pets. Store between 2 and 30 degrees C (36 and 86 degrees F). Throw away any unused medication after the expiration date. NOTE: This sheet is a summary. It may not cover all possible information. If you have questions about this medicine, talk to your doctor, pharmacist, or health care provider.  2023 Elsevier/Gold Standard (2020-06-23 00:00:00)

## 2021-10-24 ENCOUNTER — Telehealth: Payer: Self-pay | Admitting: *Deleted

## 2021-10-24 NOTE — Telephone Encounter (Signed)
Nurtec PA, Key: [id number]. Case KN:[id number]; Approved; Coverage Start Date:09/24/2021; Coverage End Date:10/24/2022. Approval faxed to pharmacy.

## 2021-10-31 ENCOUNTER — Ambulatory Visit (INDEPENDENT_AMBULATORY_CARE_PROVIDER_SITE_OTHER): Payer: BC Managed Care – PPO

## 2021-10-31 ENCOUNTER — Encounter (HOSPITAL_COMMUNITY): Payer: Self-pay | Admitting: Emergency Medicine

## 2021-10-31 ENCOUNTER — Ambulatory Visit (HOSPITAL_COMMUNITY)
Admission: EM | Admit: 2021-10-31 | Discharge: 2021-10-31 | Disposition: A | Discharge status: Home / Self Care | Payer: BC Managed Care – PPO | Attending: Family Medicine | Admitting: Family Medicine

## 2021-10-31 DIAGNOSIS — R059 Cough, unspecified: Secondary | ICD-10-CM

## 2021-10-31 DIAGNOSIS — R0602 Shortness of breath: Secondary | ICD-10-CM

## 2021-10-31 DIAGNOSIS — R051 Acute cough: Secondary | ICD-10-CM | POA: Diagnosis not present

## 2021-10-31 DIAGNOSIS — R062 Wheezing: Secondary | ICD-10-CM | POA: Diagnosis not present

## 2021-10-31 MED ORDER — BENZONATATE 100 MG PO CAPS: ORAL_CAPSULE | ORAL | 0 refills | Status: DC

## 2021-10-31 MED ORDER — PREDNISONE 20 MG PO TABS: 40.0000 mg | ORAL_TABLET | Freq: Every day | ORAL | 0 refills | Status: DC

## 2021-10-31 NOTE — Discharge Instructions (Addendum)
Be aware, your blood sugars will increase while taking prednisone. Your chest x-ray did not show any signs of pneumonia.

## 2021-10-31 NOTE — ED Triage Notes (Signed)
Pt is present today with c/o body aches, HA, and cough. Pt states sx started last wednesday

## 2021-11-03 NOTE — ED Provider Notes (Signed)
[facility]   [id number] 10/31/21 Arrival Time: 1126  ASSESSMENT & PLAN:  1. Acute cough   2. Wheezing    I have personally viewed the imaging studies ordered this visit. No acute changes on CXR.  No resp distress. Discussed typical duration of viral illnesses. OTC symptom care as needed.  Begin: Discharge Medication List as of 10/31/2021  1:54 PM     START taking these medications   Details  benzonatate (TESSALON) 100 MG capsule Take 1 capsule by mouth every 8 (eight) hours for cough., Normal    predniSONE (DELTASONE) 20 MG tablet Take 2 tablets (40 mg total) by mouth daily., Starting Wed 10/31/2021, Normal         Follow-up Information     Ileana Ladd, MD.   Specialty: Family Medicine Why: If worsening or failing to improve as anticipated. Contact information: [address] [place] [address] [phone]                 Reviewed expectations re: course of current medical issues. Questions answered. Outlined signs and symptoms indicating need for more acute intervention. Understanding verbalized. After Visit Summary given.   SUBJECTIVE: History from: Patient. Madeline Maynard is a 51 y.o. female. Reports: body aches, HA, cough; over past week. Denies: fever. Ques wheezing at times. Normal PO intake without n/v/d.  OBJECTIVE:  Vitals:   10/31/21 1241  BP: (!) 159/95  Pulse: 69  Resp: 18  Temp: 98.2 F (36.8 C)  SpO2: 97%    General appearance: alert; no distress Eyes: PERRLA; EOMI; conjunctiva normal HENT: [place]; AT; with nasal congestion Neck: supple  Lungs: speaks full sentences without difficulty; unlabored; mild bilat exp wheezing Extremities: no edema Skin: warm and dry Neurologic: normal gait Psychological: alert and cooperative; normal mood and affect   Imaging: DG Chest 2 View  Result Date: 10/31/2021 CLINICAL DATA:  Cough and shortness of breath. Headache and body aches. EXAM: CHEST - 2 VIEW COMPARISON:   04/17/2020 FINDINGS: Mild enlargement of the cardiopericardial silhouette, without edema. The lungs appear clear. No blunting of the costophrenic angles. No significant bony abnormality. IMPRESSION: Stable mild enlargement of the cardiopericardial silhouette. Otherwise unremarkable. Electronically Signed   By: Gaylyn Rong M.D.   On: 10/31/2021 13:32    Allergies  Allergen Reactions   Sulfa Antibiotics Hives    Past Medical History:  Diagnosis Date   Controlled diabetes mellitus type II without complication (HCC)    Missed abortion    Palpitations    Wears glasses    Social History   Socioeconomic History   Marital status: Married    Spouse name: Not on file   Number of children: 3   Years of education: Not on file   Highest education level: Bachelor's degree (e.g., BA, AB, BS)  Occupational History   Occupation: Armed forces logistics/support/administrative officer: [employer]  Tobacco Use   Smoking status: Never   Smokeless tobacco: Never  Vaping Use   Vaping Use: Never used  Substance and Sexual Activity   Alcohol use: No   Drug use: No   Sexual activity: Yes    Birth control/protection: None  Other Topics Concern   Not on file  Social History Narrative   She has been married to her husband for 7 months.  She has 3 children who all live with her.   She walks 4 minutes a day..      She comes a very large family, originally from [place]  She had 17 total siblings 8 from the same mother all from the same father.   Social Determinants of Health   Financial Resource Strain: Not on file  Food Insecurity: Not on file  Transportation Needs: Not on file  Physical Activity: Not on file  Stress: Not on file  Social Connections: Not on file  Intimate Partner Violence: Not on file   Family History  Problem Relation Age of Onset   Diabetes Sister    Sudden death Mother 25       While living in [place], was walking home, stated that she did not feel well and collapsed dead   Diabetes  Father    Past Surgical History:  Procedure Laterality Date   DILATION AND CURETTAGE OF UTERUS  2010   DILATION AND EVACUATION N/A 07/15/2013   Procedure: DILATATION AND EVACUATION;  Surgeon: [name], MD;  Location: [facility] [place];  Service: Gynecology;  Laterality: N/AMardella Layman, MD 11/03/21 1443

## 2021-12-28 ENCOUNTER — Encounter (HOSPITAL_COMMUNITY): Payer: Self-pay | Admitting: Emergency Medicine

## 2021-12-28 ENCOUNTER — Ambulatory Visit (HOSPITAL_COMMUNITY)
Admission: EM | Admit: 2021-12-28 | Discharge: 2021-12-28 | Disposition: A | Discharge status: Home / Self Care | Payer: BC Managed Care – PPO | Attending: Emergency Medicine | Admitting: Emergency Medicine

## 2021-12-28 DIAGNOSIS — L74511 Primary focal hyperhidrosis, face: Secondary | ICD-10-CM | POA: Insufficient documentation

## 2021-12-28 DIAGNOSIS — R42 Dizziness and giddiness: Secondary | ICD-10-CM | POA: Insufficient documentation

## 2021-12-28 HISTORY — DX: Dizziness and giddiness: R42

## 2021-12-28 LAB — CBC WITH DIFFERENTIAL/PLATELET
Abs Immature Granulocytes: 0.01 10*3/uL (ref 0.00–0.07)
Basophils Absolute: 0 10*3/uL (ref 0.0–0.1)
Basophils Relative: 0 %
Eosinophils Absolute: 0 10*3/uL (ref 0.0–0.5)
Eosinophils Relative: 1 %
HCT: 37.8 % (ref 36.0–46.0)
Hemoglobin: 12.3 g/dL (ref 12.0–15.0)
Immature Granulocytes: 0 %
Lymphocytes Relative: 32 %
Lymphs Abs: 1.6 10*3/uL (ref 0.7–4.0)
MCH: 26.5 pg (ref 26.0–34.0)
MCHC: 32.5 g/dL (ref 30.0–36.0)
MCV: 81.5 fL (ref 80.0–100.0)
Monocytes Absolute: 0.4 10*3/uL (ref 0.1–1.0)
Monocytes Relative: 8 %
Neutro Abs: 2.9 10*3/uL (ref 1.7–7.7)
Neutrophils Relative %: 59 %
Platelets: 240 10*3/uL (ref 150–400)
RBC: 4.64 MIL/uL (ref 3.87–5.11)
RDW: 14.7 % (ref 11.5–15.5)
WBC: 4.9 10*3/uL (ref 4.0–10.5)
nRBC: 0 % (ref 0.0–0.2)

## 2021-12-28 LAB — COMPREHENSIVE METABOLIC PANEL
ALT: 15 U/L (ref 0–44)
AST: 18 U/L (ref 15–41)
Albumin: 3.9 g/dL (ref 3.5–5.0)
Alkaline Phosphatase: 72 U/L (ref 38–126)
Anion gap: 10 (ref 5–15)
BUN: 12 mg/dL (ref 6–20)
CO2: 23 mmol/L (ref 22–32)
Calcium: 9.6 mg/dL (ref 8.9–10.3)
Chloride: 104 mmol/L (ref 98–111)
Creatinine, Ser: 0.79 mg/dL (ref 0.44–1.00)
GFR, Estimated: >60 mL/min (ref >60)
Glucose, Bld: 117 mg/dL — ABNORMAL HIGH (ref 70–99)
Potassium: 4 mmol/L (ref 3.5–5.1)
Sodium: 137 mmol/L (ref 135–145)
Total Bilirubin: 0.6 mg/dL (ref 0.3–1.2)
Total Protein: 8.1 g/dL (ref 6.5–8.1)

## 2021-12-28 LAB — CBG MONITORING, ED: Glucose-Capillary: 107 mg/dL — ABNORMAL HIGH (ref 70–99)

## 2021-12-28 NOTE — Discharge Instructions (Addendum)
I believe your symptoms may be related to abnormalities with your blood sugar.  I recommend not taking your metformin tonight, monitoring your blood sugar and seeing how you feel tomorrow. Follow up with your primary care provider at your appointment next week.  We will call you if any of your blood work returns abnormal.  Please go to the emergency department if symptoms worsen.

## 2021-12-28 NOTE — ED Provider Notes (Signed)
[facility]    CSN: [id number] Arrival date & time: 12/28/21  1709      History   Chief Complaint Chief Complaint  Patient presents with   Dizziness    HPI Madeline Maynard is a 51 y.o. female.  Presents with episode of dizziness and sweating that occurred around 3 PM while she was sitting inside at work.  Mild nausea.  Reports it resolved after 30 minutes.  Still feels some facial sweating at this time, additionally has some chills. Her blood glucose before the episode occurred was 120.  Had eaten lunch and was drinking lots of water. Did not take metformin last night.  Denies fever, chest pain or tightness, shortness of breath or trouble breathing, abdominal pain, vomiting/diarrhea. No weakness, numbness or tingling in the extremities. No back or neck pain. Denies urinary symptoms.  No congestion, cough, sore throat, no recent illness.  Reports she takes metformin once nightly.  Feels her sugars are low in the morning and she gets jittery.  Last A1c was 6 something.  She does have an appointment with her primary care provider next week.  History of vertigo, takes meclizine as needed.  Past Medical History:  Diagnosis Date   Controlled diabetes mellitus type II without complication (HCC)    Missed abortion    Palpitations    Vertigo    Wears glasses     Patient Active Problem List   Diagnosis Date Noted   Migraine without aura and without status migrainosus, not intractable 10/23/2021   Rapid palpitations 06/25/2019   Heart murmur 06/25/2019    Past Surgical History:  Procedure Laterality Date   DILATION AND CURETTAGE OF UTERUS  2010   DILATION AND EVACUATION N/A 07/15/2013   Procedure: DILATATION AND EVACUATION;  Surgeon: [name], MD;  Location[facility];  Service: Gynecology;  Laterality: N/A;    OB History   No obstetric history on file.      Home Medications    Prior to Admission medications   Medication Sig  Start Date End Date Taking? Authorizing Provider  Ascorbic Acid (VITAMIN C) 100 MG tablet Take 100 mg by mouth daily.    [provider]  ferrous sulfate 325 (65 FE) MG EC tablet Take 325 mg by mouth daily.    [provider]  meclizine (ANTIVERT) 25 MG tablet Take 1-2 tablets (25-50 mg total) by mouth 3 (three) times daily as needed for dizziness. Patient taking differently: Take 25-50 mg by mouth as needed for dizziness. 03/02/21   Pricilla Loveless, MD  metFORMIN (GLUCOPHAGE-XR) 500 MG 24 hr tablet SMARTSIG:1 Tablet(s) By Mouth Every Evening 06/01/21   [provider]  Multiple Vitamins-Minerals (WOMENS MULTIVITAMIN PO) Take 1 tablet by mouth daily.    [provider]  Rimegepant Sulfate (NURTEC) 75 MG TBDP Take 75 mg by mouth daily as needed. For migraines. Take as close to onset of migraine as possible. One daily maximum. 10/23/21   Anson Fret, MD  Rimegepant Sulfate (NURTEC) 75 MG TBDP Take 75 mg by mouth daily as needed. For migraines. Take as close to onset of migraine as possible. One daily maximum. 10/23/21   Anson Fret, MD  VITAMIN D PO Take 1 tablet by mouth daily.     [provider]    Family History Family History  Problem Relation Age of Onset   Diabetes Sister    Sudden death Mother 23       While living in  [place], was walking home, stated that she did not feel well and collapsed dead   Diabetes Father     Social History Social History   Tobacco Use   Smoking status: Never   Smokeless tobacco: Never  Vaping Use   Vaping Use: Never used  Substance Use Topics   Alcohol use: No   Drug use: No     Allergies   Sulfa antibiotics   Review of Systems Review of Systems  Neurological:  Positive for dizziness.   Per HPI  Physical Exam Triage Vital Signs ED Triage Vitals [12/28/21 1723]  Enc Vitals Group     BP (!) 179/94     Pulse Rate 61     Resp 18     Temp 98.3 F (36.8 C)     Temp src      SpO2 98 %      Weight      Height      Head Circumference      Peak Flow      Pain Score 0     Pain Loc      Pain Edu?      Excl. in [place]?    No data found.  Updated Vital Signs BP (!) 179/94 (BP Location: Left Arm)   Pulse 61   Temp 98.3 F (36.8 C)   Resp 18   LMP 03/13/2020   SpO2 98%   Physical Exam Vitals and nursing note reviewed.  Constitutional:      General: She is not in acute distress.    Appearance: She is not ill-appearing or diaphoretic.  HENT:     Head: Normocephalic and atraumatic.     Nose: Nose normal.     Mouth/Throat:     Mouth: Mucous membranes are moist.     Pharynx: Oropharynx is clear.  Eyes:     Extraocular Movements: Extraocular movements intact.     Conjunctiva/sclera: Conjunctivae normal.     Pupils: Pupils are equal, round, and reactive to light.  Cardiovascular:     Rate and Rhythm: Normal rate and regular rhythm.     Heart sounds: Normal heart sounds.  Pulmonary:     Effort: Pulmonary effort is normal. No respiratory distress.     Breath sounds: Normal breath sounds.  Abdominal:     Palpations: Abdomen is soft.     Tenderness: There is no abdominal tenderness.  Musculoskeletal:        General: Normal range of motion.     Cervical back: Normal range of motion.  Lymphadenopathy:     Cervical: No cervical adenopathy.  Neurological:     General: No focal deficit present.     Mental Status: She is alert and oriented to person, place, and time.     Cranial Nerves: Cranial nerves 2-12 are intact. No facial asymmetry.     Sensory: Sensation is intact. No sensory deficit.     Motor: Motor function is intact. No weakness.     Coordination: Coordination is intact. Coordination normal.     Gait: Gait is intact. Gait normal.     Comments: Strength 5/5 all extremities      UC Treatments / Results  Labs (all labs ordered are listed, but only abnormal results are displayed) Labs Reviewed  CBG MONITORING, ED - Abnormal; Notable for the following  components:      Result Value   Glucose-Capillary 107 (*)    All other components within normal limits  CBC WITH DIFFERENTIAL/PLATELET  COMPREHENSIVE METABOLIC PANEL    EKG  Radiology No results found.  Procedures Procedures  Medications Ordered in UC Medications - No data to display  Initial Impression / Assessment and Plan / UC Course  I have reviewed the triage vital signs and the nursing notes.  Pertinent labs & imaging results that were available during my care of the patient were reviewed by me and considered in my medical decision making (see chart for details).  Blood glucose 107. CBC and CMP pending. EKG normal sinus with ventricular rate of 60 bpm.  No ischemic changes.   Physical exam and neurological exam are unremarkable.  Unknown etiology at this time but I believe it may be related to her blood sugar.  I recommend discussing this with her primary care provider.  Since she feels her sugars are low in the morning and takes metformin at nighttime, I recommend not taking her metformin again tonight and seeing how she feels tomorrow morning.  Close monitoring of symptoms with strict emergency department precautions. Patient agrees to plan.  Final Clinical Impressions(s) / UC Diagnoses   Final diagnoses:  Dizziness  Facial sweating     Discharge Instructions      I believe your symptoms may be related to abnormalities with your blood sugar.  I recommend not taking your metformin tonight, monitoring your blood sugar and seeing how you feel tomorrow. Follow up with your primary care provider at your appointment next week.  We will call you if any of your blood work returns abnormal.  Please go to the emergency department if symptoms worsen.      ED Prescriptions   None    PDMP not reviewed this encounter.   Kathrine Haddock 12/28/21 1835

## 2021-12-28 NOTE — ED Triage Notes (Signed)
Reports today started feeling dizzy and sweaty with nausea. Last about 30 minutes. Had to put fan on self when symptoms started. Then afterwards had chills.  Reports CBG 120 when spell happened.

## 2022-03-24 IMAGING — MG MM DIGITAL DIAGNOSTIC UNILAT*R* W/ TOMO W/ CAD
6 series · 6 of 18 positions shown · non-contrast
Comparison: Previous exam(s).

CLINICAL DATA: 49-year-old female presenting as a recall from
screening for possible right breast asymmetry.

EXAM:
DIGITAL DIAGNOSTIC UNILATERAL RIGHT MAMMOGRAM WITH TOMOSYNTHESIS AND
CAD; ULTRASOUND RIGHT BREAST LIMITED
TECHNIQUE: Right digital diagnostic mammography and breast tomosynthesis was
performed. The images were evaluated with computer-aided detection.;
Targeted ultrasound examination of the right breast was performed

[R ML synth-2D]
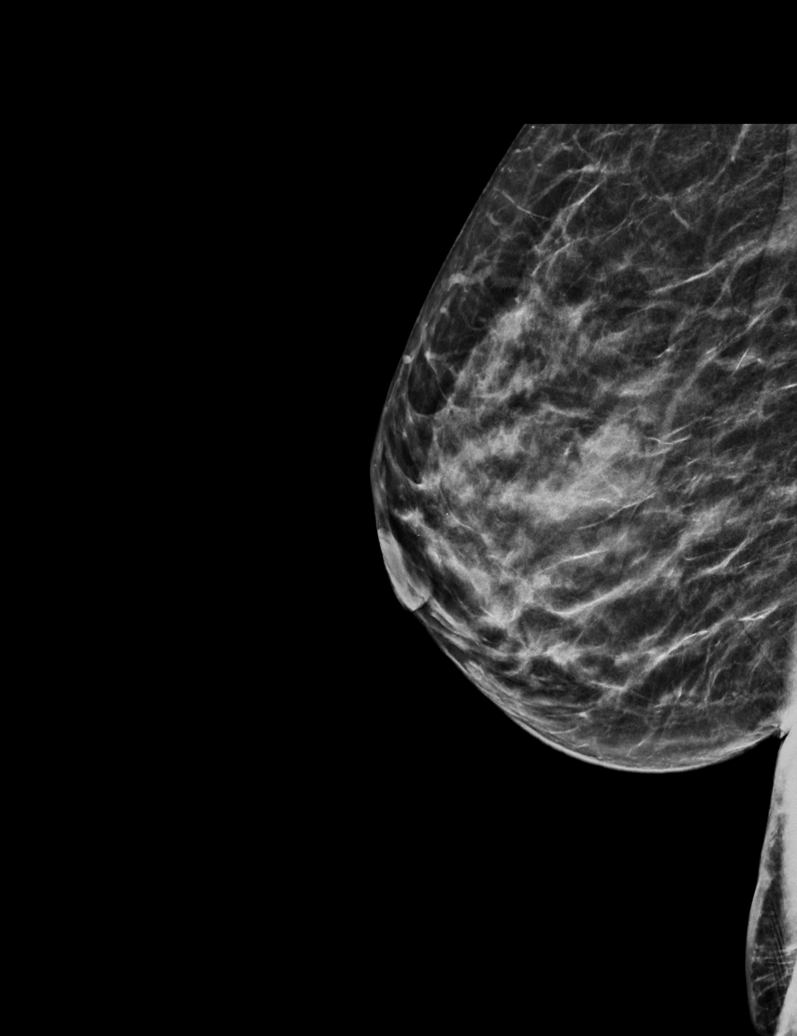

[R CC synth-2D (1 of 2)]
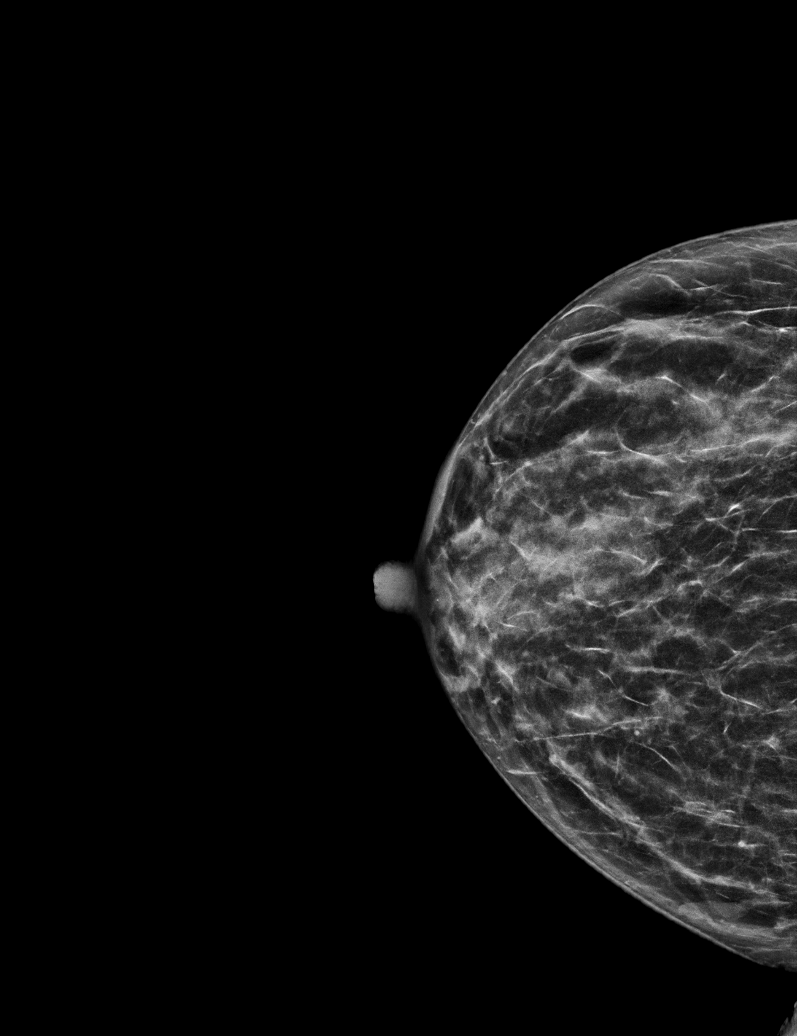

[R CC synth-2D (2 of 2)]
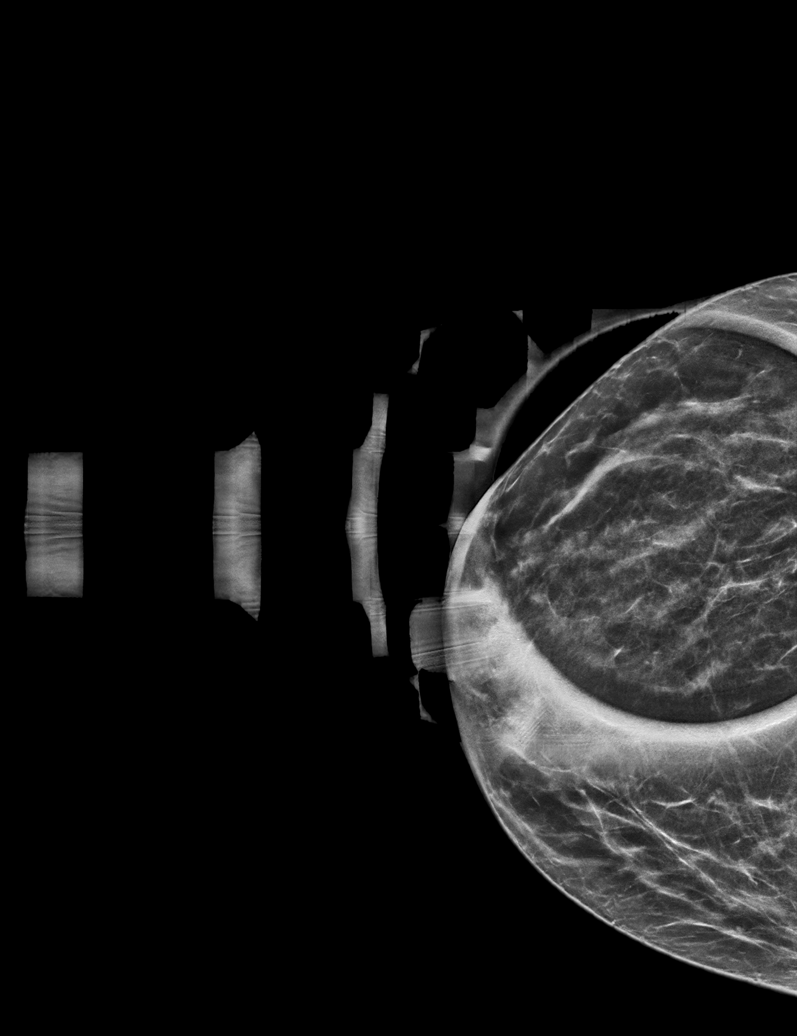

[R CC tomo (1 of 2) · tomo slice 24/47.0]
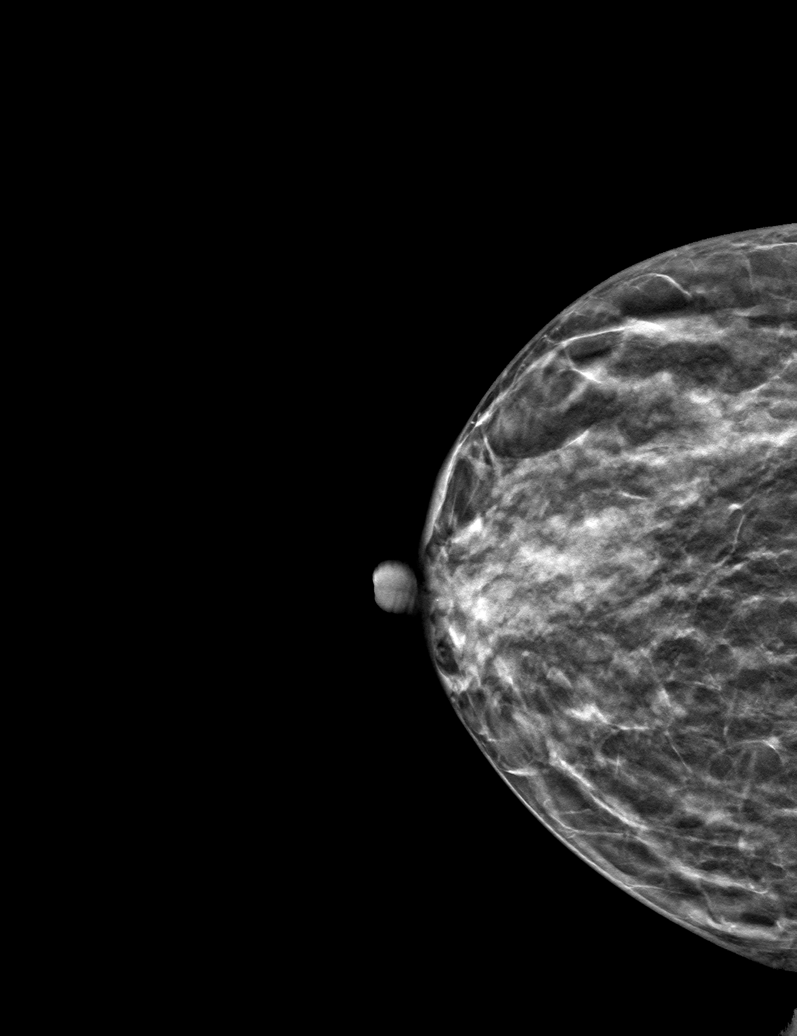

[R ML tomo · tomo slice 27/52.0]
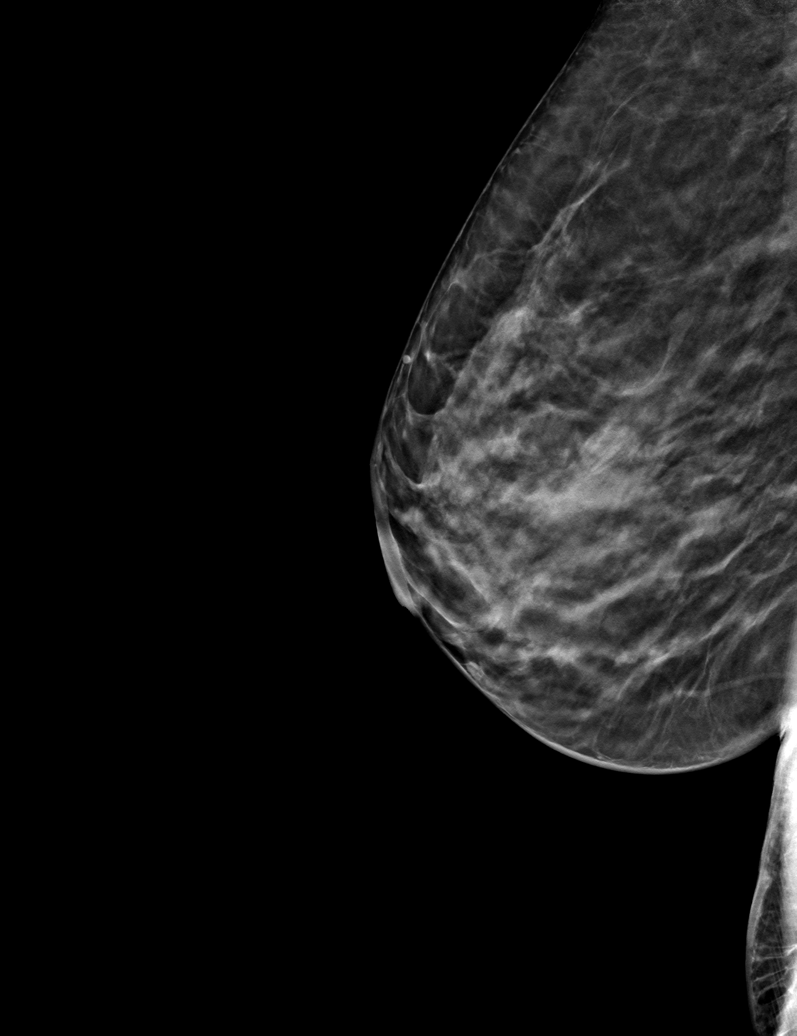

[R CC tomo (2 of 2) · tomo slice 23/44.0]
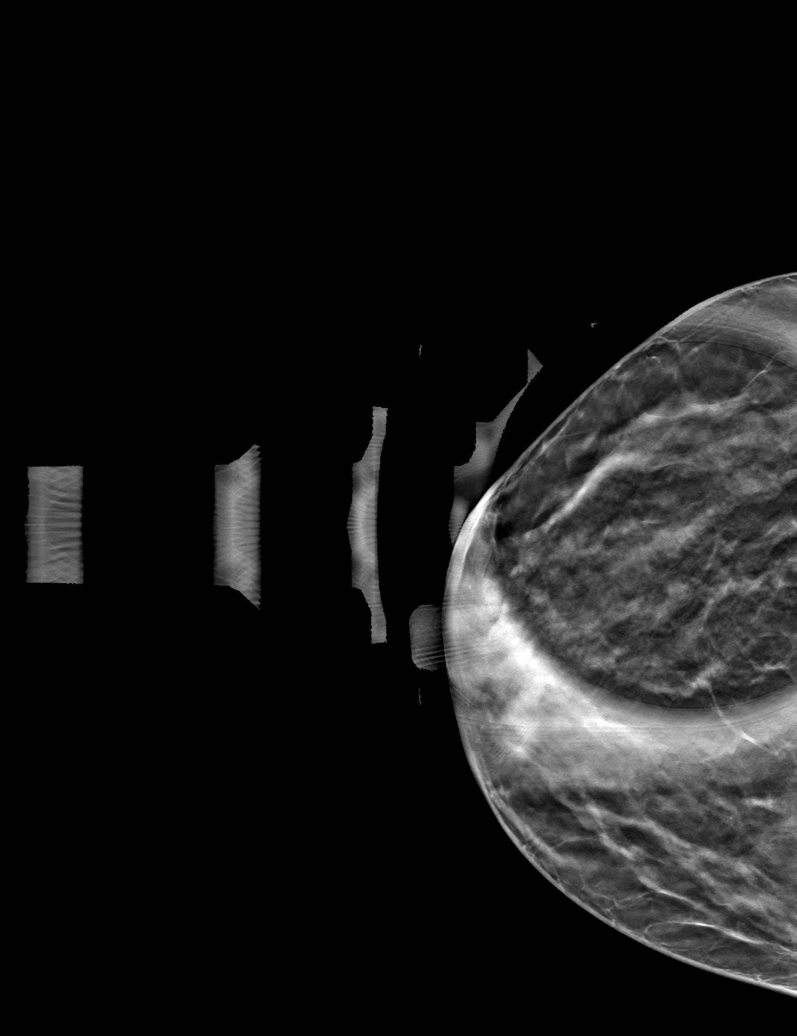

[6 of 18 positions shown; findings below may reference images not displayed]

ACR Breast Density Category c: The breast tissue is heterogeneously
dense, which may obscure small masses.
FINDINGS: Mammogram:

Full field rolled cc and mL tomosynthesis views as well as spot
compression cc tomosynthesis views of the right breast were
performed. The questioned asymmetry seen on screening mammogram in
the slightly outer right breast does not persist on today's imaging.

Ultrasound:

Targeted ultrasound performed throughout the lateral aspect of the
right breast demonstrating scattered fibrocystic changes. There is
no suspicious solid mass.
IMPRESSION: Benign fibrocystic changes in the outer right breast. No
mammographic or sonographic evidence of malignancy.

RECOMMENDATION:
Screening mammogram in one year.(Code:[id number])

I have discussed the findings and recommendations with the patient.
If applicable, a reminder letter will be sent to the patient
regarding the next appointment.

BI-RADS CATEGORY  2: Benign.

## 2022-04-30 ENCOUNTER — Ambulatory Visit: Payer: BC Managed Care – PPO | Admitting: Neurology

## 2022-05-13 ENCOUNTER — Other Ambulatory Visit: Payer: Self-pay | Admitting: Obstetrics and Gynecology

## 2022-05-13 DIAGNOSIS — R928 Other abnormal and inconclusive findings on diagnostic imaging of breast: Secondary | ICD-10-CM

## 2022-05-22 ENCOUNTER — Other Ambulatory Visit: Payer: BC Managed Care – PPO

## 2022-05-22 ENCOUNTER — Ambulatory Visit (HOSPITAL_COMMUNITY)
Admission: EM | Admit: 2022-05-22 | Discharge: 2022-05-22 | Disposition: A | Discharge status: Home / Self Care | Payer: BC Managed Care – PPO | Attending: Emergency Medicine | Admitting: Emergency Medicine

## 2022-05-22 ENCOUNTER — Encounter (HOSPITAL_COMMUNITY): Payer: Self-pay

## 2022-05-22 DIAGNOSIS — Z1152 Encounter for screening for COVID-19: Secondary | ICD-10-CM | POA: Insufficient documentation

## 2022-05-22 DIAGNOSIS — E119 Type 2 diabetes mellitus without complications: Secondary | ICD-10-CM | POA: Insufficient documentation

## 2022-05-22 DIAGNOSIS — Z7984 Long term (current) use of oral hypoglycemic drugs: Secondary | ICD-10-CM | POA: Insufficient documentation

## 2022-05-22 DIAGNOSIS — R058 Other specified cough: Secondary | ICD-10-CM | POA: Diagnosis not present

## 2022-05-22 DIAGNOSIS — Z833 Family history of diabetes mellitus: Secondary | ICD-10-CM | POA: Insufficient documentation

## 2022-05-22 DIAGNOSIS — J069 Acute upper respiratory infection, unspecified: Secondary | ICD-10-CM | POA: Insufficient documentation

## 2022-05-22 DIAGNOSIS — Z79899 Other long term (current) drug therapy: Secondary | ICD-10-CM | POA: Insufficient documentation

## 2022-05-22 MED ORDER — NIRMATRELVIR/RITONAVIR (PAXLOVID)TABLET: 3.0000 | ORAL_TABLET | Freq: Two times a day (BID) | ORAL | 0 refills | Status: AC

## 2022-05-22 MED ORDER — IBUPROFEN 800 MG PO TABS
ORAL_TABLET | ORAL | Status: AC
  Filled 2022-05-22: qty 1

## 2022-05-22 MED ORDER — IBUPROFEN 600 MG PO TABS: 600.0000 mg | ORAL_TABLET | Freq: Four times a day (QID) | ORAL | 0 refills | Status: DC | PRN

## 2022-05-22 MED ORDER — IBUPROFEN 800 MG PO TABS
800.0000 mg | ORAL_TABLET | Freq: Once | ORAL | Status: AC
  Administered 2022-05-22: 800 mg via ORAL

## 2022-05-22 NOTE — ED Triage Notes (Signed)
Pt states congestion,body aches and headache for the past 4 days. States she has not been taking anything at home.

## 2022-05-22 NOTE — ED Provider Notes (Signed)
[facility]    CSN: [id number] Arrival date & time: 05/22/22  9518     History   Chief Complaint Chief Complaint  Patient presents with   Nasal Congestion    HPI Madeline Maynard is a 51 y.o. female.  Presents with 4 day history of congestion, headache, body aches, fatigue. Aches today 7/10 Some cough No fevers No vomiting/diarrhea, eating and drinking ok Has not tried medications  Flu exposure recently, husband is also sick with similar  Past Medical History:  Diagnosis Date   Controlled diabetes mellitus type II without complication (HCC)    Missed abortion    Palpitations    Vertigo    Wears glasses     Patient Active Problem List   Diagnosis Date Noted   Migraine without aura and without status migrainosus, not intractable 10/23/2021   Rapid palpitations 06/25/2019   Heart murmur 06/25/2019    Past Surgical History:  Procedure Laterality Date   DILATION AND CURETTAGE OF UTERUS  2010   DILATION AND EVACUATION N/A 07/15/2013   Procedure: DILATATION AND EVACUATION;  Surgeon: [name], MD;  Location: [facility] [place];  Service: Gynecology;  Laterality: N/A;    OB History   No obstetric history on file.      Home Medications    Prior to Admission medications   Medication Sig Start Date End Date Taking? Authorizing Provider  ibuprofen (ADVIL) 600 MG tablet Take 1 tablet (600 mg total) by mouth every 6 (six) hours as needed. 05/22/22  Yes Demarrio Menges, Lurena Joiner, PA-C  nirmatrelvir/ritonavir (PAXLOVID) 20 x 150 MG & 10 x 100MG  TABS Take 3 tablets by mouth 2 (two) times daily for 5 days. Patient GFR is >60. Take nirmatrelvir (150 mg) two tablets twice daily for 5 days and ritonavir (100 mg) one tablet twice daily for 5 days. 05/22/22 05/27/22 Yes Yarden Hillis, 05/29/22, PA-C  Ascorbic Acid (VITAMIN C) 100 MG tablet Take 100 mg by mouth daily.    [provider]  ferrous sulfate 325 (65 FE) MG EC tablet Take 325 mg by mouth daily.     [provider]  meclizine (ANTIVERT) 25 MG tablet Take 1-2 tablets (25-50 mg total) by mouth 3 (three) times daily as needed for dizziness. Patient taking differently: Take 25-50 mg by mouth as needed for dizziness. 03/02/21   03/04/21, MD  metFORMIN (GLUCOPHAGE-XR) 500 MG 24 hr tablet SMARTSIG:1 Tablet(s) By Mouth Every Evening 06/01/21   [provider]  Multiple Vitamins-Minerals (WOMENS MULTIVITAMIN PO) Take 1 tablet by mouth daily.    [provider]  Rimegepant Sulfate (NURTEC) 75 MG TBDP Take 75 mg by mouth daily as needed. For migraines. Take as close to onset of migraine as possible. One daily maximum. 10/23/21   10/25/21, MD  Rimegepant Sulfate (NURTEC) 75 MG TBDP Take 75 mg by mouth daily as needed. For migraines. Take as close to onset of migraine as possible. One daily maximum. 10/23/21   10/25/21, MD  VITAMIN D PO Take 1 tablet by mouth daily.     [provider]    Family History Family History  Problem Relation Age of Onset   Diabetes Sister    Sudden death Mother 97       While living in [place], was walking home, stated that she did not feel well and collapsed dead   Diabetes Father     Social History Social History   Tobacco Use   Smoking status: Never  Smokeless tobacco: Never  Vaping Use   Vaping Use: Never used  Substance Use Topics   Alcohol use: No   Drug use: No     Allergies   Sulfa antibiotics   Review of Systems Review of Systems Per HPI  Physical Exam Triage Vital Signs ED Triage Vitals  Enc Vitals Group     BP 05/22/22 0937 (!) 150/87     Pulse Rate 05/22/22 0937 62     Resp 05/22/22 0937 16     Temp 05/22/22 0937 98.2 F (36.8 C)     Temp Source 05/22/22 0937 Oral     SpO2 05/22/22 0937 99 %     Weight --      Height --      Head Circumference --      Peak Flow --      Pain Score 05/22/22 0938 7     Pain Loc --      Pain Edu? --      Excl. in [place]? --    No data  found.  Updated Vital Signs BP (!) 150/87 (BP Location: Left Arm)   Pulse 62   Temp 98.2 F (36.8 C) (Oral)   Resp 16   LMP 03/13/2020   SpO2 99%    Physical Exam Vitals and nursing note reviewed.  Constitutional:      General: She is not in acute distress. HENT:     Right Ear: Tympanic membrane and ear canal normal.     Left Ear: Tympanic membrane and ear canal normal.     Nose: Congestion present.     Mouth/Throat:     Mouth: Mucous membranes are moist.     Pharynx: Uvula midline. No posterior oropharyngeal erythema.     Tonsils: No tonsillar exudate or tonsillar abscesses.  Eyes:     Conjunctiva/sclera: Conjunctivae normal.     Pupils: Pupils are equal, round, and reactive to light.  Cardiovascular:     Rate and Rhythm: Normal rate and regular rhythm.     Heart sounds: Normal heart sounds.  Pulmonary:     Effort: Pulmonary effort is normal. No respiratory distress.     Breath sounds: Normal breath sounds. No wheezing.  Musculoskeletal:     Cervical back: Normal range of motion.  Lymphadenopathy:     Cervical: No cervical adenopathy.  Neurological:     Mental Status: She is alert and oriented to person, place, and time.     UC Treatments / Results  Labs (all labs ordered are listed, but only abnormal results are displayed) Labs Reviewed  RESP PANEL BY RT-PCR (FLU A&B, COVID) [id number]    EKG  Radiology No results found.  Procedures Procedures   Medications Ordered in UC Medications  ibuprofen (ADVIL) tablet 800 mg (800 mg Oral Given 05/22/22 1025)    Initial Impression / Assessment and Plan / UC Course  I have reviewed the triage vital signs and the nursing notes.  Pertinent labs & imaging results that were available during my care of the patient were reviewed by me and considered in my medical decision making (see chart for details).  Covid/flu pending although patient understands she is out of the window for flu antivirals. Provided paper  prescription for paxlovid as patient is on day 4 - can fill if results positive.  Otherwise discussed viral etiology, symptomatic care. Ibuprofen dose given in clinic for aches. Work note provided. Return precautions discussed. Patient agrees to plan  Final Clinical Impressions(s) / UC  Diagnoses   Final diagnoses:  Viral URI with cough     Discharge Instructions      We will call you if your covid/flu test returns positive.  You will also see these results on mychart. If covid is positive, you can take the paper prescription for antivirals to any pharmacy and have it filled.  Continue symptomatic care in the meantime. I recommend mucinex (guaifenesin) with lots of fluids. Use ibuprofen or tylenol for aches/headache.    ED Prescriptions     Medication Sig Dispense Auth. Provider   ibuprofen (ADVIL) 600 MG tablet Take 1 tablet (600 mg total) by mouth every 6 (six) hours as needed. 21 tablet Kino Dunsworth, PA-C   nirmatrelvir/ritonavir (PAXLOVID) 20 x 150 MG & 10 x 100MG  TABS Take 3 tablets by mouth 2 (two) times daily for 5 days. Patient GFR is >60. Take nirmatrelvir (150 mg) two tablets twice daily for 5 days and ritonavir (100 mg) one tablet twice daily for 5 days. 30 tablet Joby Richart, , PA-C      PDMP not reviewed this encounter.   Orlanda Frankum, Lurena Joiner, Lurena Joiner 05/22/22 1231

## 2022-05-22 NOTE — Discharge Instructions (Addendum)
We will call you if your covid/flu test returns positive.  You will also see these results on mychart. If covid is positive, you can take the paper prescription for antivirals to any pharmacy and have it filled.  Continue symptomatic care in the meantime. I recommend mucinex (guaifenesin) with lots of fluids. Use ibuprofen or tylenol for aches/headache.

## 2022-05-23 LAB — RESP PANEL BY RT-PCR (FLU A&B, COVID) ARPGX2
Influenza A by PCR: NEGATIVE
Influenza B by PCR: NEGATIVE
SARS Coronavirus 2 by RT PCR: NEGATIVE

## 2022-05-30 ENCOUNTER — Ambulatory Visit
Admission: RE | Admit: 2022-05-30 | Discharge: 2022-05-30 | Disposition: A | Discharge status: Home / Self Care | Payer: BC Managed Care – PPO | Source: Ambulatory Visit | Attending: Obstetrics and Gynecology | Admitting: Obstetrics and Gynecology

## 2022-05-30 ENCOUNTER — Other Ambulatory Visit: Payer: Self-pay | Admitting: Obstetrics and Gynecology

## 2022-05-30 DIAGNOSIS — R928 Other abnormal and inconclusive findings on diagnostic imaging of breast: Secondary | ICD-10-CM

## 2022-05-30 DIAGNOSIS — N6310 Unspecified lump in the right breast, unspecified quadrant: Secondary | ICD-10-CM

## 2022-06-04 ENCOUNTER — Ambulatory Visit
Admission: RE | Admit: 2022-06-04 | Discharge: 2022-06-04 | Disposition: A | Discharge status: Home / Self Care | Payer: 59 | Source: Ambulatory Visit | Attending: Obstetrics and Gynecology | Admitting: Obstetrics and Gynecology

## 2022-06-04 DIAGNOSIS — N6310 Unspecified lump in the right breast, unspecified quadrant: Secondary | ICD-10-CM

## 2022-06-04 HISTORY — PX: BREAST BIOPSY: SHX20

## 2022-10-27 ENCOUNTER — Other Ambulatory Visit (HOSPITAL_COMMUNITY): Payer: Self-pay

## 2022-11-19 ENCOUNTER — Ambulatory Visit: Payer: 59 | Admitting: Neurology

## 2022-11-19 ENCOUNTER — Encounter: Payer: Self-pay | Admitting: Neurology

## 2022-11-19 NOTE — Progress Notes (Deleted)
Patient: Madeline Maynard Date of Birth: 09-Oct-1970  Reason for Visit: Follow up History from: Patient Primary Neurologist: Lucia Gaskins    ASSESSMENT AND PLAN 52 y.o. year old female   1.  Chronic migraine headaches   HISTORY OF PRESENT ILLNESS: Today 11/19/22  HISTORY  Initial Visit 10/23/21 Dr. Lucia Gaskins HPI:  Madeline Maynard is a 52 y.o. female here as requested by Ileana Ladd, MD for daily headaches, obesity, vitamin D deficiency, iron deficiency anemia, hypoglycemia, migraines, imbalance, sciatic nerve pain, BMI 40-44, BPPV of right ear, type 2 diabetes, nocturnal leg cramps. PMHx diabetes type 2, palpitations, headaches, heart murmur.  I reviewed emergency room notes from August 25, 2021 with normal CT of the head and prior MRI of the brain and CTA of head and neck which were both normal as well (see below) I also reviewed Dr. Phylliss Bob notes, her primary care, consult for daily headaches, it appears she is on (in addition to her other medication) Imitrex and Fioricet and metoclopramide which can be used for headache management.  Dr. Nash Dimmer note states that she has daily headaches, benign paroxysmal positional vertigo over the right ear causing imbalance, the daily headaches are new, when he last saw her he ordered a CT of the brain which is normal per my review.    Migraines started in 2022 after a vertiginous episode in 2022 (see below) MRi was normal, CTA H&N  was normal, mother used to have very bad headaches, the headaches started in the setting of pre-menopausal symptoms, worse with menses but not strictly related to menses. Migraines start on the right side, to the back of the neck, lasts 1-2 days, tylenol doesn't help, no recent dizziness or vertigo with the migraines, pulsating/pounding/throbbing/heavy/pressure, light and sounds sensitivity, sound can be severe, movement makes it worse, nausea, her husband is here and provides information, she has about 4 migraine days a month that  can be moderate to severe, a dark room helps, no aura, each migraine can last up to 2 days but no other headaches. No other focal neurologic deficits, associated symptoms, inciting events or modifiable factors.  REVIEW OF SYSTEMS: Out of a complete 14 system review of symptoms, the patient complains only of the following symptoms, and all other reviewed systems are negative.  See HPI  ALLERGIES: Allergies  Allergen Reactions   Sulfa Antibiotics Hives    HOME MEDICATIONS: Outpatient Medications Prior to Visit  Medication Sig Dispense Refill   Ascorbic Acid (VITAMIN C) 100 MG tablet Take 100 mg by mouth daily.     ferrous sulfate 325 (65 FE) MG EC tablet Take 325 mg by mouth daily.     ibuprofen (ADVIL) 600 MG tablet Take 1 tablet (600 mg total) by mouth every 6 (six) hours as needed. 21 tablet 0   meclizine (ANTIVERT) 25 MG tablet Take 1-2 tablets (25-50 mg total) by mouth 3 (three) times daily as needed for dizziness. (Patient taking differently: Take 25-50 mg by mouth as needed for dizziness.) 20 tablet 0   metFORMIN (GLUCOPHAGE-XR) 500 MG 24 hr tablet SMARTSIG:1 Tablet(s) By Mouth Every Evening     Multiple Vitamins-Minerals (WOMENS MULTIVITAMIN PO) Take 1 tablet by mouth daily.     Rimegepant Sulfate (NURTEC) 75 MG TBDP Take 75 mg by mouth daily as needed. For migraines. Take as close to onset of migraine as possible. One daily maximum. 16 tablet 11   Rimegepant Sulfate (NURTEC) 75 MG TBDP Take 75 mg by mouth daily as needed. For migraines.  Take as close to onset of migraine as possible. One daily maximum. 2 tablet 0   VITAMIN D PO Take 1 tablet by mouth daily.      No facility-administered medications prior to visit.    PAST MEDICAL HISTORY: Past Medical History:  Diagnosis Date   Controlled diabetes mellitus type II without complication (HCC)    Missed abortion    Palpitations    Vertigo    Wears glasses     PAST SURGICAL HISTORY: Past Surgical History:  Procedure  Laterality Date   BREAST BIOPSY Right 06/04/2022   [place] RT BREAST BX W LOC DEV 1ST LESION IMG BX SPEC US GUIDE 06/04/2022 [facility]   DILATION AND CURETTAGE OF UTERUS  2010   DILATION AND EVACUATION N/A 07/15/2013   Procedure: DILATATION AND EVACUATION;  Surgeon: [name], MD;  Location: [facility];  Service: Gynecology;  Laterality: N/A;    FAMILY HISTORY: Family History  Problem Relation Age of Onset   Diabetes Sister    Sudden death Mother 75       While living in [place], was walking home, stated that she did not feel well and collapsed dead   Diabetes Father     SOCIAL HISTORY: Social History   Socioeconomic History   Marital status: Married    Spouse name: Not on file   Number of children: 3   Years of education: Not on file   Highest education level: Bachelor's degree (e.g., BA, AB, BS)  Occupational History   Occupation: Armed forces logistics/support/administrative officer: [employer]  Tobacco Use   Smoking status: Never   Smokeless tobacco: Never  Vaping Use   Vaping Use: Never used  Substance and Sexual Activity   Alcohol use: No   Drug use: No   Sexual activity: Yes    Birth control/protection: None  Other Topics Concern   Not on file  Social History Narrative   She has been married to her husband for 7 months.  She has 3 children who all live with her.   She walks 4 minutes a day..      She comes a very large family, originally from [place].  She had 17 total siblings 8 from the same mother all from the same father.   Social Determinants of Health   Financial Resource Strain: Not on file  Food Insecurity: Not on file  Transportation Needs: Not on file  Physical Activity: Not on file  Stress: Not on file  Social Connections: Not on file  Intimate Partner Violence: Not on file    PHYSICAL EXAM  There were no vitals filed for this visit. There is no height or weight on file to calculate BMI.  Generalized: Well developed, in no acute  distress  Neurological examination  Mentation: Alert oriented to time, place, history taking. Follows all commands speech and language fluent Cranial nerve II-XII: Pupils were equal round reactive to light. Extraocular movements were full, visual field were full on confrontational test. Facial sensation and strength were normal. Uvula tongue midline. Head turning and shoulder shrug  were normal and symmetric. Motor: The motor testing reveals 5 over 5 strength of all 4 extremities. Good symmetric motor tone is noted throughout.  Sensory: Sensory testing is intact to soft touch on all 4 extremities. No evidence of extinction is noted.  Coordination: Cerebellar testing reveals good finger-nose-finger and heel-to-shin bilaterally.  Gait and station: Gait is normal. Tandem gait is normal. Romberg is negative.  No drift is seen.  Reflexes: Deep tendon reflexes are symmetric and normal bilaterally.   DIAGNOSTIC DATA (LABS, IMAGING, TESTING) - I reviewed patient records, labs, notes, testing and imaging myself where available.  Lab Results  Component Value Date   WBC 4.9 12/28/2021   HGB 12.3 12/28/2021   HCT 37.8 12/28/2021   MCV 81.5 12/28/2021   PLT 240 12/28/2021      Component Value Date/Time   NA 137 12/28/2021 1803   K 4.0 12/28/2021 1803   CL 104 12/28/2021 1803   CO2 23 12/28/2021 1803   GLUCOSE 117 (H) 12/28/2021 1803   BUN 12 12/28/2021 1803   CREATININE 0.79 12/28/2021 1803   CREATININE 0.76 11/07/2013 1549   CALCIUM 9.6 12/28/2021 1803   PROT 8.1 12/28/2021 1803   ALBUMIN 3.9 12/28/2021 1803   AST 18 12/28/2021 1803   ALT 15 12/28/2021 1803   ALKPHOS 72 12/28/2021 1803   BILITOT 0.6 12/28/2021 1803   GFRNONAA >60 12/28/2021 1803   GFRAA >60 12/11/2019 1545   Lab Results  Component Value Date   CHOL 169 11/07/2013   HDL 77 11/07/2013   LDLCALC 81 11/07/2013   TRIG 54 11/07/2013   CHOLHDL 2.2 11/07/2013   No results found for: "HGBA1C" No results found for:  "VITAMINB12" Lab Results  Component Value Date   TSH 1.027 04/21/2019    Margie Ege, AGNP-C, DNP 11/19/2022, 5:46 AM [facility] [address], [address] [place], [place] [address] [phone]

## 2023-02-21 ENCOUNTER — Other Ambulatory Visit (HOSPITAL_COMMUNITY): Payer: Self-pay

## 2023-02-21 MED ORDER — FREESTYLE LIBRE 3 SENSOR MISC
2.0000 | 11 refills | Status: DC
  Filled 2023-02-21: qty 2, 28d supply, fill #0
  Filled 2023-04-15: qty 2, 28d supply, fill #1
  Filled 2023-05-09: qty 2, 28d supply, fill #2
  Filled 2023-06-03: qty 2, 28d supply, fill #3
  Filled 2023-07-03: qty 2, 28d supply, fill #4
  Filled 2023-07-28: qty 2, 28d supply, fill #5
  Filled 2023-09-10: qty 2, 28d supply, fill #6
  Filled 2023-10-06 – 2023-10-22 (×2): qty 2, 28d supply, fill #7
  Filled 2023-11-22 – 2023-12-26 (×2): qty 2, 28d supply, fill #8
  Filled 2024-01-30 (×2): qty 2, 28d supply, fill #9

## 2023-02-24 ENCOUNTER — Other Ambulatory Visit (HOSPITAL_COMMUNITY): Payer: Self-pay

## 2023-04-15 ENCOUNTER — Other Ambulatory Visit (HOSPITAL_COMMUNITY): Payer: Self-pay

## 2023-04-16 ENCOUNTER — Other Ambulatory Visit (HOSPITAL_COMMUNITY): Payer: Self-pay

## 2023-05-09 ENCOUNTER — Other Ambulatory Visit (HOSPITAL_COMMUNITY): Payer: Self-pay

## 2023-06-03 ENCOUNTER — Other Ambulatory Visit (HOSPITAL_COMMUNITY): Payer: Self-pay

## 2023-07-03 ENCOUNTER — Other Ambulatory Visit (HOSPITAL_COMMUNITY): Payer: Self-pay

## 2023-07-28 ENCOUNTER — Other Ambulatory Visit (HOSPITAL_COMMUNITY): Payer: Self-pay

## 2023-09-10 ENCOUNTER — Other Ambulatory Visit (HOSPITAL_COMMUNITY): Payer: Self-pay

## 2023-10-06 ENCOUNTER — Other Ambulatory Visit (HOSPITAL_COMMUNITY): Payer: Self-pay

## 2023-10-16 ENCOUNTER — Other Ambulatory Visit (HOSPITAL_COMMUNITY): Payer: Self-pay

## 2023-10-22 ENCOUNTER — Other Ambulatory Visit (HOSPITAL_COMMUNITY): Payer: Self-pay

## 2023-11-22 ENCOUNTER — Other Ambulatory Visit (HOSPITAL_COMMUNITY): Payer: Self-pay

## 2023-11-24 ENCOUNTER — Other Ambulatory Visit (HOSPITAL_COMMUNITY): Payer: Self-pay

## 2023-12-04 ENCOUNTER — Other Ambulatory Visit (HOSPITAL_COMMUNITY): Payer: Self-pay

## 2023-12-26 ENCOUNTER — Other Ambulatory Visit (HOSPITAL_COMMUNITY): Payer: Self-pay

## 2024-01-13 ENCOUNTER — Other Ambulatory Visit (HOSPITAL_COMMUNITY): Payer: Self-pay

## 2024-01-13 MED ORDER — SYNJARDY XR 10-1000 MG PO TB24
1.0000 | ORAL_TABLET | Freq: Every day | ORAL | 3 refills | Status: DC
  Filled 2024-01-13: qty 30, 30d supply, fill #0
  Filled 2024-02-16: qty 30, 30d supply, fill #1

## 2024-01-30 ENCOUNTER — Other Ambulatory Visit (HOSPITAL_COMMUNITY): Payer: Self-pay

## 2024-02-02 ENCOUNTER — Ambulatory Visit (HOSPITAL_COMMUNITY)
Admission: RE | Admit: 2024-02-02 | Discharge: 2024-02-02 | Disposition: A | Discharge status: Home / Self Care | Source: Ambulatory Visit | Attending: Family Medicine | Admitting: Family Medicine

## 2024-02-02 VITALS — BP 137/84 | HR 85 | Temp 98.5°F | Resp 18

## 2024-02-02 DIAGNOSIS — R519 Headache, unspecified: Secondary | ICD-10-CM | POA: Diagnosis not present

## 2024-02-02 MED ORDER — SUMATRIPTAN SUCCINATE 6 MG/0.5ML ~~LOC~~ SOLN
SUBCUTANEOUS | Status: AC
  Filled 2024-02-02: qty 0.5

## 2024-02-02 MED ORDER — KETOROLAC TROMETHAMINE 30 MG/ML IJ SOLN
30.0000 mg | Freq: Once | INTRAMUSCULAR | Status: AC
Start: 2024-02-02 — End: 2024-02-02
  Administered 2024-02-02: 30 mg via INTRAMUSCULAR

## 2024-02-02 MED ORDER — KETOROLAC TROMETHAMINE 30 MG/ML IJ SOLN
INTRAMUSCULAR | Status: AC
  Filled 2024-02-02: qty 1

## 2024-02-02 MED ORDER — SUMATRIPTAN SUCCINATE 6 MG/0.5ML ~~LOC~~ SOLN
6.0000 mg | Freq: Once | SUBCUTANEOUS | Status: AC
  Administered 2024-02-02: 6 mg via SUBCUTANEOUS

## 2024-02-02 MED ORDER — TIZANIDINE HCL 4 MG PO TABS: 2.0000 mg | ORAL_TABLET | Freq: Every evening | ORAL | 0 refills | Status: AC | PRN

## 2024-02-02 NOTE — ED Triage Notes (Signed)
 Patient presents to the office for headache in the back of her head that started x 3 days. Patient states she has a history of migraines. She last took Nurtec x 2 days ago.

## 2024-02-02 NOTE — ED Provider Notes (Addendum)
 [facility]    CSN: [id number] Arrival date & time: 02/02/24  0827      History   Chief Complaint Chief Complaint  Patient presents with   Headache    Entered by patient    HPI Madeline Maynard is a 53 y.o. female.    Headache Here for h/a that's been going on in the last 2 days.  It hurts in her posterior head and radiates forward and radiates down her neck.  No fever or chills no upper respiratory symptoms.  No fall or trauma and no head injury.  She has had a little bit of nausea and maybe a little bit of photophobia.  Otherwise it is different from her usual migraines.  It is throbbing at some point  She is allergic to sulfa  She does have a history of migraines and is taking her Nurtec and some ibuprofen  and Tylenol, with no relief.  Last menstrual cycle was about 3 months ago.  She is having some hot flashes denies possibility of pregnancy.  Past Medical History:  Diagnosis Date   Controlled diabetes mellitus type II without complication (HCC)    Missed abortion    Palpitations    Vertigo    Wears glasses     Patient Active Problem List   Diagnosis Date Noted   Migraine without aura and without status migrainosus, not intractable 10/23/2021   Rapid palpitations 06/25/2019   Heart murmur 06/25/2019    Past Surgical History:  Procedure Laterality Date   BREAST BIOPSY Right 06/04/2022   US  RT BREAST BX W LOC DEV 1ST LESION IMG BX SPEC US  GUIDE 06/04/2022 [facility]   DILATION AND CURETTAGE OF UTERUS  2010   DILATION AND EVACUATION N/A 07/15/2013   Procedure: DILATATION AND EVACUATION;  Surgeon: Charlie CHRISTELLA Croak, MD;  Location: [facility];  Service: Gynecology;  Laterality: N/A;    OB History   No obstetric history on file.      Home Medications    Prior to Admission medications   Medication Sig Start Date End Date Taking? Authorizing Provider  Ascorbic Acid (VITAMIN C) 100 MG tablet Take 100 mg by mouth daily.   Yes  [provider]  Continuous Glucose Sensor (FREESTYLE LIBRE 3 SENSOR) MISC Use as directed to continuously monitor blood sugar, changing the sensor every 14 days. 02/21/23  Yes   Empagliflozin -metFORMIN  HCl ER (SYNJARDY  XR) 03-999 MG TB24 Take 1 tablet by mouth daily with breakfast. 01/13/24  Yes   ferrous sulfate 325 (65 FE) MG EC tablet Take 325 mg by mouth daily.   Yes [provider]  meclizine  (ANTIVERT ) 25 MG tablet Take 1-2 tablets (25-50 mg total) by mouth 3 (three) times daily as needed for dizziness. Patient taking differently: Take 25-50 mg by mouth as needed for dizziness. 03/02/21  Yes Freddi Hamilton, MD  metFORMIN  (GLUCOPHAGE -XR) 500 MG 24 hr tablet SMARTSIG:1 Tablet(s) By Mouth Every Evening 06/01/21  Yes [provider]  Multiple Vitamins-Minerals (WOMENS MULTIVITAMIN PO) Take 1 tablet by mouth daily.   Yes [provider]  Rimegepant Sulfate (NURTEC) 75 MG TBDP Take 75 mg by mouth daily as needed. For migraines. Take as close to onset of migraine as possible. One daily maximum. 10/23/21  Yes Ines Onetha NOVAK, MD  Rimegepant Sulfate (NURTEC) 75 MG TBDP Take 75 mg by mouth daily as needed. For migraines. Take as close to onset of migraine as possible. One daily maximum. 10/23/21  Yes Ines Onetha NOVAK,  MD  tiZANidine  (ZANAFLEX ) 4 MG tablet Take 0.5-1 tablets (2-4 mg total) by mouth at bedtime as needed for muscle spasms. 02/02/24  Yes Jamier Urbas K, MD  VITAMIN D PO Take 1 tablet by mouth daily.    Yes [provider]    Family History Family History  Problem Relation Age of Onset   Diabetes Sister    Sudden death Mother 67       While living in [place], was walking home, stated that she did not feel well and collapsed dead   Diabetes Father     Social History Social History   Tobacco Use   Smoking status: Never   Smokeless tobacco: Never  Vaping Use   Vaping status: Never Used  Substance Use Topics   Alcohol use: No   Drug  use: No     Allergies   Sulfa antibiotics   Review of Systems Review of Systems  Neurological:  Positive for headaches.     Physical Exam Triage Vital Signs ED Triage Vitals  Encounter Vitals Group     BP 02/02/24 0842 137/84     Girls Systolic BP Percentile --      Girls Diastolic BP Percentile --      Boys Systolic BP Percentile --      Boys Diastolic BP Percentile --      Pulse Rate 02/02/24 0842 85     Resp 02/02/24 0842 18     Temp 02/02/24 0842 98.5 F (36.9 C)     Temp Source 02/02/24 0842 Oral     SpO2 02/02/24 0842 97 %     Weight --      Height --      Head Circumference --      Peak Flow --      Pain Score 02/02/24 0847 4     Pain Loc --      Pain Education --      Exclude from Growth Chart --    No data found.  Updated Vital Signs BP 137/84 (BP Location: Left Arm)   Pulse 85   Temp 98.5 F (36.9 C) (Oral)   Resp 18   LMP 01/24/2020   SpO2 97%   Visual Acuity Right Eye Distance:   Left Eye Distance:   Bilateral Distance:    Right Eye Near:   Left Eye Near:    Bilateral Near:     Physical Exam Vitals reviewed.  Constitutional:      General: She is not in acute distress.    Appearance: She is not ill-appearing, toxic-appearing or diaphoretic.  HENT:     Mouth/Throat:     Mouth: Mucous membranes are moist.  Eyes:     Extraocular Movements: Extraocular movements intact.     Conjunctiva/sclera: Conjunctivae normal.     Pupils: Pupils are equal, round, and reactive to light.  Cardiovascular:     Rate and Rhythm: Normal rate and regular rhythm.     Heart sounds: No murmur heard. Pulmonary:     Effort: Pulmonary effort is normal.     Breath sounds: Normal breath sounds.  Musculoskeletal:     Cervical back: Neck supple.  Lymphadenopathy:     Cervical: No cervical adenopathy.  Skin:    Coloration: Skin is not jaundiced or pale.  Neurological:     General: No focal deficit present.     Mental Status: She is alert and oriented to  person, place, and time.     Cranial Nerves:  No cranial nerve deficit.     Sensory: No sensory deficit.     Motor: No weakness.     Coordination: Coordination normal.     Gait: Gait normal.     Deep Tendon Reflexes: Reflexes normal.      UC Treatments / Results  Labs (all labs ordered are listed, but only abnormal results are displayed) Labs Reviewed - No data to display  EKG   Radiology No results found.  Procedures Procedures (including critical care time)  Medications Ordered in UC Medications  ketorolac  (TORADOL ) 30 MG/ML injection 30 mg (30 mg Intramuscular Given 02/02/24 0946)  SUMAtriptan  (IMITREX ) injection 6 mg (6 mg Subcutaneous Given 02/02/24 0952)    Initial Impression / Assessment and Plan / UC Course  I have reviewed the triage vital signs and the nursing notes.  Pertinent labs & imaging results that were available during my care of the patient were reviewed by me and considered in my medical decision making (see chart for details).     Toradol  and Imitrex  are given here.  This is different from her usual migraines there are some features of this headache that still could be vascular in nature.  She will go to the emergency room if she is not relieved by these treatments.  I have also sent her some muscle relaxer to the pharmacy to use at nighttime since this is more originating in her neck and back of her head.  She stated at the end of our visit that she wanted to make sure she was not having a stroke.  I have discussed with her that none of the features of what she is telling me sounds like a stroke, as she does not have any muscle weakness or numbness or facial droop, etc.  If, again, she is not relieved by her injections here, she will go to the emergency room for further evaluation Final Clinical Impressions(s) / UC Diagnoses   Final diagnoses:  Intractable headache, unspecified chronicity pattern, unspecified headache type     Discharge Instructions       You have been given a shot of Toradol  30 mg Imitrex /sumatriptan  6 mg today.   Take tizanidine  4 mg--1 at bedtime as needed for muscle spasms; this medication can cause dizziness and sleepiness  If symptoms are not relieved by the injections given here, please go to emergency room for further evaluation          ED Prescriptions     Medication Sig Dispense Auth. Provider   tiZANidine  (ZANAFLEX ) 4 MG tablet Take 0.5-1 tablets (2-4 mg total) by mouth at bedtime as needed for muscle spasms. 5 tablet Vinicius Brockman, Sharlet POUR, MD      PDMP not reviewed this encounter.   Vonna Sharlet POUR, MD 02/02/24 [phone]    Vonna Sharlet POUR, MD 02/02/24 [phone]

## 2024-02-02 NOTE — Discharge Instructions (Signed)
 You have been given a shot of Toradol  30 mg Imitrex /sumatriptan  6 mg today.   Take tizanidine  4 mg--1 at bedtime as needed for muscle spasms; this medication can cause dizziness and sleepiness  If symptoms are not relieved by the injections given here, please go to emergency room for further evaluation

## 2024-02-03 ENCOUNTER — Other Ambulatory Visit (HOSPITAL_COMMUNITY): Payer: Self-pay

## 2024-02-04 ENCOUNTER — Emergency Department (HOSPITAL_COMMUNITY)

## 2024-02-04 ENCOUNTER — Other Ambulatory Visit: Payer: Self-pay

## 2024-02-04 ENCOUNTER — Emergency Department (HOSPITAL_COMMUNITY)
Admission: EM | Admit: 2024-02-04 | Discharge: 2024-02-04 | Disposition: A | Discharge status: Home / Self Care | Attending: Emergency Medicine | Admitting: Emergency Medicine

## 2024-02-04 DIAGNOSIS — R519 Headache, unspecified: Secondary | ICD-10-CM | POA: Diagnosis present

## 2024-02-04 DIAGNOSIS — Z7984 Long term (current) use of oral hypoglycemic drugs: Secondary | ICD-10-CM | POA: Diagnosis not present

## 2024-02-04 DIAGNOSIS — E119 Type 2 diabetes mellitus without complications: Secondary | ICD-10-CM | POA: Insufficient documentation

## 2024-02-04 DIAGNOSIS — G43809 Other migraine, not intractable, without status migrainosus: Secondary | ICD-10-CM | POA: Diagnosis not present

## 2024-02-04 LAB — CBG MONITORING, ED: Glucose-Capillary: 179 mg/dL — ABNORMAL HIGH (ref 70–99)

## 2024-02-04 MED ORDER — METOCLOPRAMIDE HCL 5 MG/ML IJ SOLN
10.0000 mg | Freq: Once | INTRAMUSCULAR | Status: AC
  Administered 2024-02-04: 10 mg via INTRAVENOUS
  Filled 2024-02-04: qty 2

## 2024-02-04 MED ORDER — KETOROLAC TROMETHAMINE 30 MG/ML IJ SOLN
30.0000 mg | Freq: Once | INTRAMUSCULAR | Status: AC
  Administered 2024-02-04: 30 mg via INTRAVENOUS
  Filled 2024-02-04: qty 1

## 2024-02-04 MED ORDER — DIPHENHYDRAMINE HCL 50 MG/ML IJ SOLN
12.5000 mg | Freq: Once | INTRAMUSCULAR | Status: AC
  Administered 2024-02-04: 12.5 mg via INTRAVENOUS
  Filled 2024-02-04: qty 1

## 2024-02-04 MED ORDER — SODIUM CHLORIDE 0.9 % IV BOLUS
1000.0000 mL | Freq: Once | INTRAVENOUS | Status: AC
  Administered 2024-02-04: 1000 mL via INTRAVENOUS

## 2024-02-04 NOTE — ED Triage Notes (Addendum)
 Pt. Stated, I started having a headache last Thursday and I went to UC on Monday  and they gave me 2 shots and it helped it and then came back. They said t come here if it came back. I just change my diabetes medicine 3 weeks ago.

## 2024-02-04 NOTE — ED Triage Notes (Signed)
 PT here for 6/10 HA radiating posterior to anterior. Pt has hx of migraines and was seen and tx on Monday at [facility] and told if it comes back to come to ED.

## 2024-02-04 NOTE — Discharge Instructions (Signed)
 Return if any problems.

## 2024-02-04 NOTE — ED Provider Notes (Signed)
 [place] EMERGENCY DEPARTMENT AT [facility] Provider Note   CSN: [id number] Arrival date & time: 02/04/24  9177     Patient presents with: Headache   Madeline Maynard is a 53 y.o. female.   Patient complains of a headache mostly in the posterior aspect of her head and neck.  Patient reports she was recently seen at urgent care for the same and was advised to come to the emergency department if symptoms worsened.  Patient states that she got relief from the medication she was given at urgent care however symptoms have returned.  Patient reports having a recent change in her diabetes medication and wonders if this is related to the headaches.  Patient denies any elevation in glucose  The history is provided by the patient. No language interpreter was used.  Headache      Prior to Admission medications   Medication Sig Start Date End Date Taking? Authorizing Provider  Ascorbic Acid (VITAMIN C) 100 MG tablet Take 100 mg by mouth daily.    [provider]  Continuous Glucose Sensor (FREESTYLE LIBRE 3 SENSOR) MISC Use as directed to continuously monitor blood sugar, changing the sensor every 14 days. 02/21/23     Empagliflozin -metFORMIN  HCl ER (SYNJARDY  XR) 03-999 MG TB24 Take 1 tablet by mouth daily with breakfast. 01/13/24     ferrous sulfate 325 (65 FE) MG EC tablet Take 325 mg by mouth daily.    [provider]  meclizine  (ANTIVERT ) 25 MG tablet Take 1-2 tablets (25-50 mg total) by mouth 3 (three) times daily as needed for dizziness. Patient taking differently: Take 25-50 mg by mouth as needed for dizziness. 03/02/21   Freddi Hamilton, MD  metFORMIN  (GLUCOPHAGE -XR) 500 MG 24 hr tablet SMARTSIG:1 Tablet(s) By Mouth Every Evening 06/01/21   [provider]  Multiple Vitamins-Minerals (WOMENS MULTIVITAMIN PO) Take 1 tablet by mouth daily.    [provider]  Rimegepant Sulfate (NURTEC) 75 MG TBDP Take 75 mg by mouth daily as needed. For  migraines. Take as close to onset of migraine as possible. One daily maximum. 10/23/21   Ines Onetha NOVAK, MD  Rimegepant Sulfate (NURTEC) 75 MG TBDP Take 75 mg by mouth daily as needed. For migraines. Take as close to onset of migraine as possible. One daily maximum. 10/23/21   Ines Onetha NOVAK, MD  tiZANidine  (ZANAFLEX ) 4 MG tablet Take 0.5-1 tablets (2-4 mg total) by mouth at bedtime as needed for muscle spasms. 02/02/24   Banister, Pamela K, MD  VITAMIN D PO Take 1 tablet by mouth daily.     [provider]    Allergies: Sulfa antibiotics    Review of Systems  Neurological:  Positive for headaches.  All other systems reviewed and are negative.   Updated Vital Signs BP 123/79   Pulse 69   Temp 98.2 F (36.8 C) (Oral)   Resp 20   Ht 5' (1.524 m)   Wt 73.5 kg   LMP 11/02/2023   SpO2 98%   BMI 31.64 kg/m   Physical Exam Vitals and nursing note reviewed.  Constitutional:      Appearance: She is well-developed.  HENT:     Head: Normocephalic.     Mouth/Throat:     Mouth: Mucous membranes are moist.  Eyes:     Extraocular Movements: Extraocular movements intact.     Pupils: Pupils are equal, round, and reactive to light.  Cardiovascular:     Rate and Rhythm: Normal rate.  Heart sounds: Normal heart sounds.  Pulmonary:     Effort: Pulmonary effort is normal.  Abdominal:     General: There is no distension.  Musculoskeletal:        General: Normal range of motion.     Cervical back: Normal range of motion.  Skin:    General: Skin is warm.  Neurological:     General: No focal deficit present.     Mental Status: She is alert and oriented to person, place, and time.     Sensory: Sensory deficit present.  Psychiatric:        Mood and Affect: Mood normal.     (all labs ordered are listed, but only abnormal results are displayed) Labs Reviewed  CBG MONITORING, ED - Abnormal; Notable for the following components:      Result Value   Glucose-Capillary 179 (*)     All other components within normal limits    EKG: None  Radiology: CT Head Wo Contrast Result Date: 02/04/2024 EXAM: CT HEAD WITHOUT CONTRAST 02/04/2024 10:27:12 AM TECHNIQUE: CT of the head was performed without the administration of intravenous contrast. Automated exposure control, iterative reconstruction, and/or weight based adjustment of the mA/kV was utilized to reduce the radiation dose to as low as reasonably achievable. COMPARISON: 07/26/2021 CLINICAL HISTORY: Headache, increasing frequency or severity. No contrast; PT here for 6/10 HA radiating posterior to anterior. Pt has hx of migraines and was seen and tx on Monday at [facility] and told if it comes back to come to ED. FINDINGS: BRAIN AND VENTRICLES: No acute hemorrhage. No evidence of acute infarct. No hydrocephalus. No extra-axial collection. No mass effect or midline shift. ORBITS: No acute abnormality. SINUSES: No acute abnormality. SOFT TISSUES AND SKULL: No acute soft tissue abnormality. No skull fracture. IMPRESSION: 1. No acute intracranial abnormality. Electronically signed by: Evalene Coho MD 02/04/2024 10:38 AM EDT RP Workstation: [id number]     Procedures   Medications Ordered in the ED  sodium chloride  0.9 % bolus 1,000 mL (1,000 mLs Intravenous New Bag/Given 02/04/24 1015)  ketorolac  (TORADOL ) 30 MG/ML injection 30 mg (30 mg Intravenous Given 02/04/24 1015)  metoCLOPramide  (REGLAN ) injection 10 mg (10 mg Intravenous Given 02/04/24 1015)  diphenhydrAMINE  (BENADRYL ) injection 12.5 mg (12.5 mg Intravenous Given 02/04/24 1015)                                    Medical Decision Making Patient complains of a headache.  Patient has a history of migraines on Nurtec was treated over care 2 days ago.  Amount and/or Complexity of Data Reviewed Labs: ordered. Decision-making details documented in ED Course.    Details: Point-of-care glucose is 179 Radiology: ordered and independent interpretation performed. Decision-making  details documented in ED Course.    Details: CT head no acute abnormalities  Risk Prescription drug management. Risk Details: Patient given IV fluids, Toradol  Reglan  and Benadryl .  Patient reports complete relief of headache.  Patient has Nurtec at home for her headaches.  She has been seen by [facility] neurology.  Patient is advised to schedule appointment with her neurologist for recheck.  Return if any problems        Final diagnoses:  Other migraine without status migrainosus, not intractable    ED Discharge Orders     None       An After Visit Summary was printed and given to the patient.    Flint Raring  K, PA-C 02/04/24 1219    Ruthe Cornet, DO 02/04/24 1246

## 2024-02-04 NOTE — ED Notes (Signed)
 Patient transported to CT

## 2024-02-19 ENCOUNTER — Other Ambulatory Visit (HOSPITAL_COMMUNITY): Payer: Self-pay

## 2024-02-19 MED ORDER — DAPAGLIFLOZIN PROPANEDIOL 5 MG PO TABS
5.0000 mg | ORAL_TABLET | Freq: Every day | ORAL | 0 refills | Status: DC
  Filled 2024-02-19: qty 30, 30d supply, fill #0

## 2024-02-19 MED ORDER — METFORMIN HCL ER 500 MG PO TB24
1000.0000 mg | ORAL_TABLET | Freq: Every evening | ORAL | 0 refills | Status: DC
  Filled 2024-02-19: qty 60, 30d supply, fill #0

## 2024-03-03 ENCOUNTER — Other Ambulatory Visit (HOSPITAL_COMMUNITY): Payer: Self-pay

## 2024-03-04 ENCOUNTER — Other Ambulatory Visit (HOSPITAL_COMMUNITY): Payer: Self-pay

## 2024-03-04 MED ORDER — FREESTYLE LIBRE 3 SENSOR MISC
1.0000 | 11 refills | Status: AC
  Filled 2024-03-04: qty 2, 28d supply, fill #0
  Filled 2024-04-25 – 2024-05-28 (×2): qty 2, 28d supply, fill #1

## 2024-03-23 ENCOUNTER — Other Ambulatory Visit (HOSPITAL_COMMUNITY): Payer: Self-pay

## 2024-03-23 MED ORDER — DAPAGLIFLOZIN PROPANEDIOL 5 MG PO TABS
5.0000 mg | ORAL_TABLET | Freq: Every day | ORAL | 1 refills | Status: AC
  Filled 2024-03-23: qty 30, 30d supply, fill #0
  Filled 2024-04-25 – 2024-04-26 (×4): qty 30, 30d supply, fill #1

## 2024-03-23 MED ORDER — CELECOXIB 100 MG PO CAPS
100.0000 mg | ORAL_CAPSULE | Freq: Two times a day (BID) | ORAL | 0 refills | Status: AC
  Filled 2024-03-23: qty 60, 30d supply, fill #0

## 2024-03-23 MED ORDER — METFORMIN HCL ER 500 MG PO TB24
1000.0000 mg | ORAL_TABLET | Freq: Every day | ORAL | 0 refills | Status: AC
  Filled 2024-03-23: qty 60, 30d supply, fill #0
  Filled 2024-04-25 – 2024-04-26 (×4): qty 60, 30d supply, fill #1

## 2024-03-24 ENCOUNTER — Encounter (HOSPITAL_COMMUNITY): Payer: Self-pay

## 2024-03-24 ENCOUNTER — Emergency Department (HOSPITAL_COMMUNITY)
Admission: EM | Admit: 2024-03-24 | Discharge: 2024-03-24 | Disposition: A | Discharge status: Home / Self Care | Attending: Emergency Medicine | Admitting: Emergency Medicine

## 2024-03-24 ENCOUNTER — Emergency Department (HOSPITAL_COMMUNITY)

## 2024-03-24 DIAGNOSIS — R072 Precordial pain: Secondary | ICD-10-CM | POA: Insufficient documentation

## 2024-03-24 DIAGNOSIS — R079 Chest pain, unspecified: Secondary | ICD-10-CM

## 2024-03-24 DIAGNOSIS — R42 Dizziness and giddiness: Secondary | ICD-10-CM

## 2024-03-24 DIAGNOSIS — E871 Hypo-osmolality and hyponatremia: Secondary | ICD-10-CM | POA: Insufficient documentation

## 2024-03-24 DIAGNOSIS — E1165 Type 2 diabetes mellitus with hyperglycemia: Secondary | ICD-10-CM | POA: Diagnosis not present

## 2024-03-24 DIAGNOSIS — Z7984 Long term (current) use of oral hypoglycemic drugs: Secondary | ICD-10-CM | POA: Insufficient documentation

## 2024-03-24 LAB — CBC
HCT: 38.4 % (ref 36.0–46.0)
Hemoglobin: 12.1 g/dL (ref 12.0–15.0)
MCH: 25.8 pg — ABNORMAL LOW (ref 26.0–34.0)
MCHC: 31.5 g/dL (ref 30.0–36.0)
MCV: 81.9 fL (ref 80.0–100.0)
Platelets: 259 K/uL (ref 150–400)
RBC: 4.69 MIL/uL (ref 3.87–5.11)
RDW: 14.9 % (ref 11.5–15.5)
WBC: 5.4 K/uL (ref 4.0–10.5)
nRBC: 0 % (ref 0.0–0.2)

## 2024-03-24 LAB — BASIC METABOLIC PANEL WITH GFR
Anion gap: 8 (ref 5–15)
BUN: 11 mg/dL (ref 6–20)
CO2: 25 mmol/L (ref 22–32)
Calcium: 9 mg/dL (ref 8.9–10.3)
Chloride: 99 mmol/L (ref 98–111)
Creatinine, Ser: 0.84 mg/dL (ref 0.44–1.00)
GFR, Estimated: >60 mL/min (ref >60)
Glucose, Bld: 153 mg/dL — ABNORMAL HIGH (ref 70–99)
Potassium: 3.5 mmol/L (ref 3.5–5.1)
Sodium: 132 mmol/L — ABNORMAL LOW (ref 135–145)

## 2024-03-24 LAB — TROPONIN I (HIGH SENSITIVITY)
Troponin I (High Sensitivity): 3 ng/L (ref <18)
Troponin I (High Sensitivity): 4 ng/L (ref <18)

## 2024-03-24 NOTE — Discharge Instructions (Signed)
 You were seen in the emergency department after an episode of dizziness chest pain and sweating.  You had blood work EKG and chest x-ray that did not show an obvious explanation for your symptoms.  It is unclear if it is related to your new medication.  I would recommend holding that for now and following up with your primary care doctor.  Return to the emergency department if any worsening or concerning symptoms.

## 2024-03-24 NOTE — ED Notes (Signed)
 PT discharged to home skin warm,dry, and natural in color.  Breathing is even and unlabored. PT educated on follow up. PT to follow up as directed. No further questions at this time.

## 2024-03-24 NOTE — ED Provider Triage Note (Signed)
 Emergency Medicine Provider Triage Evaluation Note  Madeline Maynard , a 53 y.o. female  was evaluated in triage.  Pt complains of chest pain and weakness that started around noon.  She states she was sitting in the break room when this happened.  She does have a history of hypertension, diabetes.  She states her left arm was bothering her so she was seen by her PCP yesterday and started on celecoxib.  Review of Systems  Positive: As above Negative: As above  Physical Exam  BP (!) 144/86   Pulse 76   Temp 98 F (36.7 C) (Oral)   Resp 18   SpO2 100%  Gen:   Awake, no distress   Resp:  Normal effort  MSK:   Moves extremities without difficulty  Other:    Medical Decision Making  Medically screening exam initiated at 1:34 PM.  Appropriate orders placed.  Madeline Maynard was informed that the remainder of the evaluation will be completed by another provider, this initial triage assessment does not replace that evaluation, and the importance of remaining in the ED until their evaluation is complete.     Hildegard Loge, PA-C 03/24/24 1336

## 2024-03-24 NOTE — ED Notes (Signed)
 Extra DG & Blue top drawn

## 2024-03-24 NOTE — ED Triage Notes (Signed)
 Pt having chest tightness and weakness since noon. Pt states she just started taking Celebrex and was unsure if that would cause her symptoms so wanted to be checked out.

## 2024-03-24 NOTE — ED Provider Notes (Signed)
 [place] EMERGENCY DEPARTMENT AT [facility] Provider Note   CSN: [id number] Arrival date & time: 03/24/24  1311     Patient presents with: Weakness and Chest Pain   Madeline Maynard is a 53 y.o. female.  She is here with a complaint of an episode of upper chest pain weakness dizziness and sweats that started around 12 PM.  She said it lasted about 5 or 10 minutes and resolved.  She had just started a new medication Celebrex for left arm discomfort.  Took 1 pill last night and 1 this morning.  She has had the dizziness before but not in a while.  She feels back to baseline now.  She just wanted to get checked out make sure that there was nothing serious going on.  She is a diabetic, no history of cardiac disease.  Does have a history of vertigo.  Also has a history of palpitations.   The history is provided by the patient.  Chest Pain Pain location:  Substernal area Pain quality: aching   Pain radiates to:  Does not radiate Pain severity:  Moderate Onset quality:  Gradual Duration:  5 minutes Timing:  Constant Progression:  Resolved Relieved by:  None tried Associated symptoms: diaphoresis and dizziness   Associated symptoms: no abdominal pain, no cough, no fever, no nausea, no shortness of breath and no vomiting   Risk factors: diabetes mellitus   Risk factors: no coronary artery disease        Prior to Admission medications   Medication Sig Start Date End Date Taking? Authorizing Provider  Ascorbic Acid (VITAMIN C) 100 MG tablet Take 100 mg by mouth daily.    [provider]  celecoxib (CELEBREX) 100 MG capsule Take 1 capsule (100 mg total) by mouth 2 (two) times daily. 03/23/24     Continuous Glucose Sensor (FREESTYLE LIBRE 3 SENSOR) MISC Use as directed to continuously monitor blood glucose, changing the sensor every 14 days. 03/04/24     dapagliflozin  propanediol (FARXIGA ) 5 MG TABS tablet Take 1 tablet (5 mg total) by mouth daily. 03/23/24     ferrous  sulfate 325 (65 FE) MG EC tablet Take 325 mg by mouth daily.    [provider]  meclizine  (ANTIVERT ) 25 MG tablet Take 1-2 tablets (25-50 mg total) by mouth 3 (three) times daily as needed for dizziness. Patient taking differently: Take 25-50 mg by mouth as needed for dizziness. 03/02/21   Freddi Hamilton, MD  metFORMIN  (GLUCOPHAGE -XR) 500 MG 24 hr tablet SMARTSIG:1 Tablet(s) By Mouth Every Evening 06/01/21   [provider]  metFORMIN  (GLUCOPHAGE -XR) 500 MG 24 hr tablet Take 2 tablets (1,000 mg total) by mouth daily with evening meal. 03/23/24     Multiple Vitamins-Minerals (WOMENS MULTIVITAMIN PO) Take 1 tablet by mouth daily.    [provider]  Rimegepant Sulfate (NURTEC) 75 MG TBDP Take 75 mg by mouth daily as needed. For migraines. Take as close to onset of migraine as possible. One daily maximum. 10/23/21   Ines Onetha NOVAK, MD  Rimegepant Sulfate (NURTEC) 75 MG TBDP Take 75 mg by mouth daily as needed. For migraines. Take as close to onset of migraine as possible. One daily maximum. 10/23/21   Ines Onetha NOVAK, MD  tiZANidine  (ZANAFLEX ) 4 MG tablet Take 0.5-1 tablets (2-4 mg total) by mouth at bedtime as needed for muscle spasms. 02/02/24   Banister, Pamela K, MD  VITAMIN D PO Take 1 tablet by mouth daily.  [provider]    Allergies: Sulfa antibiotics    Review of Systems  Constitutional:  Positive for diaphoresis. Negative for fever.  Respiratory:  Negative for cough and shortness of breath.   Gastrointestinal:  Negative for abdominal pain, nausea and vomiting.  Neurological:  Positive for dizziness.    Updated Vital Signs BP (!) 144/86   Pulse 76   Temp 98 F (36.7 C) (Oral)   Resp 18   SpO2 100%   Physical Exam Vitals and nursing note reviewed.  Constitutional:      General: She is not in acute distress.    Appearance: Normal appearance. She is well-developed.  HENT:     Head: Normocephalic and atraumatic.  Eyes:      Conjunctiva/sclera: Conjunctivae normal.  Cardiovascular:     Rate and Rhythm: Normal rate and regular rhythm.     Heart sounds: Normal heart sounds. No murmur heard. Pulmonary:     Effort: Pulmonary effort is normal. No respiratory distress.     Breath sounds: Normal breath sounds. No stridor. No wheezing.  Abdominal:     Palpations: Abdomen is soft.     Tenderness: There is no abdominal tenderness. There is no guarding or rebound.  Musculoskeletal:        General: No tenderness or deformity.     Cervical back: Neck supple.     Right lower leg: No tenderness. No edema.     Left lower leg: No tenderness. No edema.  Skin:    General: Skin is warm and dry.  Neurological:     General: No focal deficit present.     Mental Status: She is alert.     GCS: GCS eye subscore is 4. GCS verbal subscore is 5. GCS motor subscore is 6.     (all labs ordered are listed, but only abnormal results are displayed) Labs Reviewed  BASIC METABOLIC PANEL WITH GFR - Abnormal; Notable for the following components:      Result Value   Sodium 132 (*)    Glucose, Bld 153 (*)    All other components within normal limits  CBC - Abnormal; Notable for the following components:   MCH 25.8 (*)    All other components within normal limits  TROPONIN I (HIGH SENSITIVITY)  TROPONIN I (HIGH SENSITIVITY)    EKG: EKG Interpretation Date/Time:  Wednesday March 24 2024 13:24:33 EDT Ventricular Rate:  72 PR Interval:  158 QRS Duration:  78 QT Interval:  392 QTC Calculation: 429 R Axis:   78  Text Interpretation: Normal sinus rhythm Biatrial enlargement Abnormal ECG When compared with ECG of 28-Dec-2021 17:29, No significant change since prior Confirmed by Towana Sharper [phone]) on 03/24/2024 4:45:31 PM  Radiology: DG Chest 2 View Result Date: 03/24/2024 EXAM: 2 VIEW(S) XRAY OF THE CHEST 03/24/2024 01:47:00 PM COMPARISON: None available. CLINICAL HISTORY: chest pain. Reason for exam: chest pain ; Per  triage notes: Pt having chest tightness and weakness since noon. Pt states she just started taking Celebrex and was unsure if that would cause her symptoms so wanted to be checked out. FINDINGS: LUNGS AND PLEURA: No focal pulmonary opacity. No pulmonary edema. No pleural effusion. No pneumothorax. HEART AND MEDIASTINUM: No acute abnormality of the cardiac and mediastinal silhouettes. Stable mild cardiac enlargement. BONES AND SOFT TISSUES: No acute osseous abnormality. IMPRESSION: 1. No acute cardiopulmonary process. Electronically signed by: Waddell Calk MD 03/24/2024 02:09 PM EDT RP Workstation: [id number]     Procedures   Medications Ordered in  the ED - No data to display  Clinical Course as of 03/24/24 1841  Wed Mar 24, 2024  1805 Patient second Cam negative.  She is having no symptoms currently.  The rest of her workup has been unremarkable.  She is comfortable plan for discharge and outpatient follow-up with her treatment team.  Return instructions discussed [MB]    Clinical Course User Index [MB] Towana Ozell BROCKS, MD                                 Medical Decision Making Amount and/or Complexity of Data Reviewed Labs: ordered. Radiology: ordered.   This patient complains of chest pain; this involves an extensive number of treatment Options and is a complaint that carries with it a high risk of complications and morbidity. The differential includes ACS, anxiety, medication side effect, reflux, PE, pneumothorax  I ordered, reviewed and interpreted labs, which included CBC normal chemistries with mildly low sodium and elevated glucose troponins flat I ordered imaging studies which included chest x-ray and I independently    visualized and interpreted imaging which showed no acute findings Previous records obtained and reviewed in epic including prior cardiology notes Cardiac monitoring reviewed, sinus rhythm Social determinants considered, no significant barriers Critical  Interventions: None  After the interventions stated above, I reevaluated the patient and found patient to be hemodynamically stable and asymptomatic Admission and further testing considered, no indications for admission or further workup at this time.  Recommended close follow-up with her treatment team.  Return instructions discussed      Final diagnoses:  Nonspecific chest pain  Dizziness    ED Discharge Orders     None          Towana Ozell BROCKS, MD 03/24/24 2048

## 2024-03-24 NOTE — ED Notes (Signed)
1 hr troponin sent.

## 2024-04-26 ENCOUNTER — Other Ambulatory Visit: Payer: Self-pay

## 2024-04-26 ENCOUNTER — Other Ambulatory Visit (HOSPITAL_COMMUNITY): Payer: Self-pay

## 2024-04-26 ENCOUNTER — Encounter (HOSPITAL_COMMUNITY): Payer: Self-pay

## 2024-04-27 ENCOUNTER — Other Ambulatory Visit (HOSPITAL_COMMUNITY): Payer: Self-pay

## 2024-05-06 ENCOUNTER — Other Ambulatory Visit (HOSPITAL_COMMUNITY): Payer: Self-pay

## 2024-05-28 ENCOUNTER — Other Ambulatory Visit (HOSPITAL_COMMUNITY): Payer: Self-pay

## 2024-06-09 ENCOUNTER — Other Ambulatory Visit (HOSPITAL_COMMUNITY): Payer: Self-pay

## 2024-07-01 ENCOUNTER — Other Ambulatory Visit (HOSPITAL_COMMUNITY): Payer: Self-pay | Admitting: Obstetrics and Gynecology

## 2024-07-01 ENCOUNTER — Ambulatory Visit (HOSPITAL_COMMUNITY)
Admission: RE | Admit: 2024-07-01 | Discharge: 2024-07-01 | Disposition: A | Discharge status: Home / Self Care | Source: Ambulatory Visit | Attending: Vascular Surgery | Admitting: Vascular Surgery

## 2024-07-01 DIAGNOSIS — M79605 Pain in left leg: Secondary | ICD-10-CM | POA: Insufficient documentation

## 2024-07-01 DIAGNOSIS — M7989 Other specified soft tissue disorders: Secondary | ICD-10-CM | POA: Insufficient documentation
